# Patient Record
Sex: Male | Born: 1990 | Race: White | Hispanic: No | Marital: Single | Smoking: Current every day smoker
Health system: Southern US, Community
[De-identification: ages and names within clinical notes are randomized; demographics above are authoritative.]

## PROBLEM LIST (undated history)

## (undated) ENCOUNTER — Emergency Department (HOSPITAL_COMMUNITY): Admission: EM | Disposition: A | Discharge status: Home / Self Care | Payer: Self-pay

## (undated) HISTORY — PX: TONSILLECTOMY: SUR1361

---

## 2018-10-15 ENCOUNTER — Encounter (HOSPITAL_COMMUNITY): Payer: Self-pay | Admitting: Emergency Medicine

## 2018-10-15 ENCOUNTER — Other Ambulatory Visit: Payer: Self-pay

## 2018-10-15 ENCOUNTER — Emergency Department (HOSPITAL_COMMUNITY)
Admission: EM | Admit: 2018-10-15 | Discharge: 2018-10-15 | Disposition: A | Discharge status: Home / Self Care | Payer: Medicaid Other | Attending: Emergency Medicine | Admitting: Emergency Medicine

## 2018-10-15 DIAGNOSIS — R51 Headache: Secondary | ICD-10-CM | POA: Diagnosis present

## 2018-10-15 DIAGNOSIS — F17210 Nicotine dependence, cigarettes, uncomplicated: Secondary | ICD-10-CM | POA: Insufficient documentation

## 2018-10-15 DIAGNOSIS — G44209 Tension-type headache, unspecified, not intractable: Secondary | ICD-10-CM | POA: Diagnosis not present

## 2018-10-15 NOTE — ED Triage Notes (Signed)
Pt c/o of headaches that will come more frequently here lately with some blurred vision. Woke up with one behind right eye with light sensitivity. Pt needs note for being out of work today.

## 2018-10-15 NOTE — ED Provider Notes (Addendum)
[facility]-EMERGENCY DEPT Provider Note   CSN: [id number] Arrival date & time: 10/15/18  0743    History   Chief Complaint Chief Complaint  Patient presents with  . Headache  . work note    HPI Cameron Carroll is a 28 y.o. male here for evaluation of headache.  6-7/10. Onset this morning upon wakening described as constant, throbbing to right side of forehead and behind right eye.  Has had a migraine at least once a week for the last month.  Occasionally tries BC powders with mild relief.  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 No previous h/o headaches. HA typically begin as mild throb and worsen during the day. Associated with light sensitivity and intermittent blurred vision.    No fever, neck pain or stiffness, diplopia, dizziness, nausea, vomiting, difficulty with speech balance, one sided numbness or weakness. No recent head trauma. No anticoagulants      HPI  History reviewed. No pertinent past medical history.  There are no active problems to display for this patient.   Past Surgical History:  Procedure Laterality Date  . TONSILLECTOMY          Home Medications    Prior to Admission medications   Medication Sig Start Date End Date Taking? Authorizing Provider  ibuprofen (ADVIL) 200 MG tablet Take 400 mg by mouth every 6 (six) hours as needed for headache.   Yes [provider]    Family History No family history on file.  Social History Social History   Tobacco Use  . Smoking status: Current Every Day Smoker    Types: Cigarettes  . Smokeless tobacco: Never Used  Substance Use Topics  . Alcohol use: Not on file  . Drug use: Not on file     Allergies   Patient has no known allergies.   Review of Systems Review of Systems  Eyes: Positive for photophobia and visual disturbance.  Neurological: Positive for headaches.   All other systems reviewed and are negative.    Physical Exam Updated Vital Signs BP 132/90 (BP Location: Left Arm)   Pulse 95   Temp 98.5 F (36.9 C) (Oral)   Resp 16   SpO2 100%   Physical Exam Vitals signs and nursing note reviewed.  Constitutional:      General: He is not in acute distress.    Appearance: He is well-developed.     Comments: NAD.  HENT:     Head: Normocephalic and atraumatic.     Comments: No temporal tenderness, palpation makes pain feel better    Right Ear: External ear normal.     Left Ear: External ear normal.     Nose: Nose normal.  Eyes:     General: No scleral icterus.    Conjunctiva/sclera: Conjunctivae normal.  Neck:     Musculoskeletal: Normal range of motion and neck supple.     Comments: No midline/paraspinal muscle tenderness. Full ROM of neck without pain, rigidity. Full chin to chest w/o meningismus.  Cardiovascular:     Rate and Rhythm: Normal rate and regular rhythm.     Heart sounds: Normal heart sounds. No murmur.  Pulmonary:     Effort: Pulmonary effort is normal.     Breath sounds: Normal breath sounds. No wheezing.  Musculoskeletal: Normal range of motion.        General: No  deformity.  Skin:    General: Skin is warm and dry.     Capillary Refill: Capillary refill takes less than 2 seconds.  Neurological:     Mental Status: He is alert and oriented to person, place, and time.     Comments: . Alert and oriented to self, place, time and event.  Speech is fluent without dysarthria or dysphasia. Strength 5/5 in upper/lower extremities.   Sensation to light touch intact in face, hands and feet. Sits on side of the bed without truncal sway No pronator drift. No leg drop. Normal finger-to-nose.  CN I not tested CN II grossly intact visual fields bilaterally. Unable to visualize posterior eye. CN III, IV, VI PEERL and EOMs intact bilaterally CN V light touch intact in all 3 divisions of trigeminal nerve CN VII facial  movements symmetric CN VIII not tested CN IX, X no uvula deviation, symmetric rise of soft palate  CN XI 5/5 SCM and trapezius strength bilaterally  CN XII Midline tongue protrusion, symmetric L/R movements  Psychiatric:        Behavior: Behavior normal.        Thought Content: Thought content normal.        Judgment: Judgment normal.      ED Treatments / Results  Labs (all labs ordered are listed, but only abnormal results are displayed) Labs Reviewed - No data to display  EKG None  Radiology No results found.  Procedures Procedures (including critical care time)  Medications Ordered in ED Medications - No data to display   Initial Impression / Assessment and Plan / ED Course  I have reviewed the triage vital signs and the nursing notes.  Pertinent labs & imaging results that were available during my care of the patient were reviewed by me and considered in my medical decision making (see chart for details).        Highest suspicion for tension type headache vs uncomplicated migraine.  Patient is without high-risk features of headache including: sudden/thunderclap HA, vision disturbances, seizures, fever, meningeal signs, use of anticoagulation, focal neuro deficits, elevated BP, trauma. On exam VS are WNL, pt is well-appearing w/ no meningismus, nystagmus, focal neuro deficits, pain over temporal arteries.  Given symptomatology and exam, doubt infectious source like meningitis, sinusitis, intracranial bleed, space occupying lesions, CVA, dissection, temporal arteritis. Given reassuring hx and exam, emergent imaging or labs not indicated. Discussed OTC oral medicines for headache.  Will defer IV migraine cocktail given low intensity of pain. Pt was comfortable with this and was eager to be discharged with work up, given. Return precautions given. OP neuro f/u as needed for recurrent HA.  Final Clinical Impressions(s) / ED Diagnoses   Final diagnoses:  Acute non  intractable tension-type headache    ED Discharge Orders    None       Jerrell Mylar 10/15/18 2015    Loren Racer, MD 10/17/18 [phone]

## 2018-10-15 NOTE — ED Notes (Signed)
He declines d/c v.s. and sign for instructions.

## 2018-10-15 NOTE — Discharge Instructions (Signed)
You were seen in the ED for headache.   I suspect headache may be due to benign migraines, tension, dehydration.   Use 600 mg ibuprofen (advil, motrin) every 6-8 hours as needed for headaches.  Can also try [phone] mg acetaminophen (tylenol) every 6-8 hours for more pain control. Excedrin migraine has caffeine that can help.  Return for worsening headaches, fever, neck pain, neck stiffness, difficulty with speech, balance, numbness or weakness to one side of your body

## 2018-12-19 ENCOUNTER — Other Ambulatory Visit: Payer: Self-pay

## 2018-12-19 ENCOUNTER — Emergency Department (HOSPITAL_COMMUNITY)
Admission: EM | Admit: 2018-12-19 | Discharge: 2018-12-19 | Disposition: A | Discharge status: Home / Self Care | Payer: Medicaid Other | Attending: Emergency Medicine | Admitting: Emergency Medicine

## 2018-12-19 ENCOUNTER — Encounter (HOSPITAL_COMMUNITY): Payer: Self-pay

## 2018-12-19 DIAGNOSIS — B349 Viral infection, unspecified: Secondary | ICD-10-CM | POA: Diagnosis not present

## 2018-12-19 DIAGNOSIS — R5383 Other fatigue: Secondary | ICD-10-CM | POA: Diagnosis present

## 2018-12-19 DIAGNOSIS — Z20828 Contact with and (suspected) exposure to other viral communicable diseases: Secondary | ICD-10-CM | POA: Insufficient documentation

## 2018-12-19 DIAGNOSIS — M7918 Myalgia, other site: Secondary | ICD-10-CM | POA: Diagnosis not present

## 2018-12-19 DIAGNOSIS — F17210 Nicotine dependence, cigarettes, uncomplicated: Secondary | ICD-10-CM | POA: Diagnosis not present

## 2018-12-19 NOTE — ED Provider Notes (Signed)
[facility] EMERGENCY DEPARTMENT Provider Note   CSN: [id number] Arrival date & time: 12/19/18  1655    History   Chief Complaint Chief Complaint  Patient presents with  . Cough  . Generalized Body Aches    HPI Cameron Carroll is a 28 y.o. male.     HPI   28 year old male presents today with complaints of fatigue.  Patient notes that he was exposed to coronavirus as his father-in-law tested positive for COVID-19.  He notes over the last several days he has had generalized body ache, some upper respiratory mucus.  He denies any productive cough fever shortness of breath abdominal pain.  No other acute concerns presently.  He notes he smokes but has no history of asthma or respiratory illnesses.  History reviewed. No pertinent past medical history.  There are no active problems to display for this patient.   Past Surgical History:  Procedure Laterality Date  . TONSILLECTOMY          Home Medications    Prior to Admission medications   Medication Sig Start Date End Date Taking? Authorizing Provider  ibuprofen (ADVIL) 200 MG tablet Take 400 mg by mouth every 6 (six) hours as needed for headache.    [provider]    Family History No family history on file.  Social History Social History   Tobacco Use  . Smoking status: Current Every Day Smoker    Types: Cigarettes  . Smokeless tobacco: Never Used  Substance Use Topics  . Alcohol use: Not on file  . Drug use: Not on file     Allergies   Patient has no known allergies.   Review of Systems Review of Systems  All other systems reviewed and are negative.    Physical Exam Updated Vital Signs BP 134/75 (BP Location: Right Arm)   Pulse 89   Temp 99 F (37.2 C) (Oral)   Resp 14   SpO2 100%   Physical Exam Vitals signs and nursing note reviewed.  Constitutional:      Appearance: He is well-developed.  HENT:     Head: Normocephalic and atraumatic.  Eyes:   General: No scleral icterus.       Right eye: No discharge.        Left eye: No discharge.     Conjunctiva/sclera: Conjunctivae normal.     Pupils: Pupils are equal, round, and reactive to light.  Neck:     Musculoskeletal: Normal range of motion.     Vascular: No JVD.     Trachea: No tracheal deviation.  Pulmonary:     Effort: Pulmonary effort is normal. No respiratory distress.     Breath sounds: Normal breath sounds. No stridor. No wheezing, rhonchi or rales.  Neurological:     Mental Status: He is alert and oriented to person, place, and time.     Coordination: Coordination normal.  Psychiatric:        Behavior: Behavior normal.        Thought Content: Thought content normal.        Judgment: Judgment normal.      ED Treatments / Results  Labs (all labs ordered are listed, but only abnormal results are displayed) Labs Reviewed  NOVEL CORONAVIRUS, NAA (HOSPITAL ORDER, SEND-OUT TO REF LAB)    EKG None  Radiology No results found.  Procedures Procedures (including critical care time)  Medications Ordered in ED Medications - No data to display   Initial Impression / Assessment  and Plan / ED Course  I have reviewed the triage vital signs and the nursing notes.  Pertinent labs & imaging results that were available during my care of the patient were reviewed by me and considered in my medical decision making (see chart for details).        28 year old male with exposure to COVID.  He has no objective findings on my exam he will be tested for COVID-19.  Discharged with strict return precautions.  He verbalized understanding and agreement to today's plan.  Cameron Carroll was evaluated in Emergency Department on 12/19/2018 for the symptoms described in the history of present illness. He was evaluated in the context of the global COVID-19 pandemic, which necessitated consideration that the patient might be at risk for infection with the SARS-CoV-2 virus that causes  COVID-19. Institutional protocols and algorithms that pertain to the evaluation of patients at risk for COVID-19 are in a state of rapid change based on information released by regulatory bodies including the CDC and federal and state organizations. These policies and algorithms were followed during the patient's care in the ED.  Final Clinical Impressions(s) / ED Diagnoses   Final diagnoses:  Viral illness    ED Discharge Orders    None       Rosalio LoudHedges, Garrit Marrow, PA-C 12/19/18 1827    Terrilee FilesButler, Michael C, MD 12/20/18 [phone]

## 2018-12-19 NOTE — Discharge Instructions (Signed)
Please read attached information. If you experience any new or worsening signs or symptoms please return to the emergency room for evaluation. Please follow-up with your primary care provider or specialist as discussed. Please use medication prescribed only as directed and discontinue taking if you have any concerning signs or symptoms.   °

## 2018-12-19 NOTE — ED Triage Notes (Signed)
Pt reports exposure to COVID from his father in law who tested positive. Pt c.o feeling like something is stuck in his throat and generalized body aches.

## 2018-12-21 LAB — NOVEL CORONAVIRUS, NAA (HOSP ORDER, SEND-OUT TO REF LAB; TAT 18-24 HRS): SARS-CoV-2, NAA: NOT DETECTED

## 2019-01-29 ENCOUNTER — Inpatient Hospital Stay (HOSPITAL_COMMUNITY)
Admission: EM | Admit: 2019-01-29 | Discharge: 2019-02-02 | DRG: 442 | Disposition: A | Discharge status: Home / Self Care | Payer: Medicaid Other | Attending: Internal Medicine | Admitting: Internal Medicine

## 2019-01-29 ENCOUNTER — Encounter (HOSPITAL_COMMUNITY): Payer: Self-pay | Admitting: *Deleted

## 2019-01-29 ENCOUNTER — Emergency Department (HOSPITAL_COMMUNITY): Payer: Medicaid Other

## 2019-01-29 DIAGNOSIS — Z87891 Personal history of nicotine dependence: Secondary | ICD-10-CM

## 2019-01-29 DIAGNOSIS — K5903 Drug induced constipation: Secondary | ICD-10-CM | POA: Diagnosis present

## 2019-01-29 DIAGNOSIS — R17 Unspecified jaundice: Secondary | ICD-10-CM | POA: Diagnosis present

## 2019-01-29 DIAGNOSIS — D696 Thrombocytopenia, unspecified: Secondary | ICD-10-CM | POA: Diagnosis not present

## 2019-01-29 DIAGNOSIS — R7989 Other specified abnormal findings of blood chemistry: Secondary | ICD-10-CM

## 2019-01-29 DIAGNOSIS — K7689 Other specified diseases of liver: Secondary | ICD-10-CM | POA: Diagnosis present

## 2019-01-29 DIAGNOSIS — B179 Acute viral hepatitis, unspecified: Secondary | ICD-10-CM | POA: Diagnosis present

## 2019-01-29 DIAGNOSIS — R112 Nausea with vomiting, unspecified: Secondary | ICD-10-CM

## 2019-01-29 DIAGNOSIS — R945 Abnormal results of liver function studies: Secondary | ICD-10-CM | POA: Diagnosis not present

## 2019-01-29 DIAGNOSIS — F1190 Opioid use, unspecified, uncomplicated: Secondary | ICD-10-CM | POA: Diagnosis present

## 2019-01-29 DIAGNOSIS — Z20828 Contact with and (suspected) exposure to other viral communicable diseases: Secondary | ICD-10-CM | POA: Diagnosis present

## 2019-01-29 DIAGNOSIS — X58XXXA Exposure to other specified factors, initial encounter: Secondary | ICD-10-CM | POA: Diagnosis present

## 2019-01-29 DIAGNOSIS — B159 Hepatitis A without hepatic coma: Secondary | ICD-10-CM

## 2019-01-29 DIAGNOSIS — D61818 Other pancytopenia: Secondary | ICD-10-CM | POA: Diagnosis present

## 2019-01-29 DIAGNOSIS — T40605A Adverse effect of unspecified narcotics, initial encounter: Secondary | ICD-10-CM | POA: Diagnosis present

## 2019-01-29 DIAGNOSIS — F1910 Other psychoactive substance abuse, uncomplicated: Secondary | ICD-10-CM | POA: Diagnosis not present

## 2019-01-29 DIAGNOSIS — Z91018 Allergy to other foods: Secondary | ICD-10-CM

## 2019-01-29 LAB — CBC
HCT: 43 % (ref 39.0–52.0)
Hemoglobin: 14.4 g/dL (ref 13.0–17.0)
MCH: 30.4 pg (ref 26.0–34.0)
MCHC: 33.5 g/dL (ref 30.0–36.0)
MCV: 90.7 fL (ref 80.0–100.0)
Platelets: 124 10*3/uL — ABNORMAL LOW (ref 150–400)
RBC: 4.74 MIL/uL (ref 4.22–5.81)
RDW: 14.4 % (ref 11.5–15.5)
WBC: 5.4 10*3/uL (ref 4.0–10.5)
nRBC: 0 % (ref 0.0–0.2)

## 2019-01-29 LAB — COMPREHENSIVE METABOLIC PANEL
ALT: 2281 U/L — ABNORMAL HIGH (ref 0–44)
AST: 1731 U/L — ABNORMAL HIGH (ref 15–41)
Albumin: 3.3 g/dL — ABNORMAL LOW (ref 3.5–5.0)
Alkaline Phosphatase: 358 U/L — ABNORMAL HIGH (ref 38–126)
Anion gap: 8 (ref 5–15)
BUN: 7 mg/dL (ref 6–20)
CO2: 29 mmol/L (ref 22–32)
Calcium: 8.7 mg/dL — ABNORMAL LOW (ref 8.9–10.3)
Chloride: 98 mmol/L (ref 98–111)
Creatinine, Ser: 0.78 mg/dL (ref 0.61–1.24)
GFR calc Af Amer: >60 mL/min (ref >60)
GFR calc non Af Amer: >60 mL/min (ref >60)
Glucose, Bld: 117 mg/dL — ABNORMAL HIGH (ref 70–99)
Potassium: 3.9 mmol/L (ref 3.5–5.1)
Sodium: 135 mmol/L (ref 135–145)
Total Bilirubin: 7.7 mg/dL — ABNORMAL HIGH (ref 0.3–1.2)
Total Protein: 6.4 g/dL — ABNORMAL LOW (ref 6.5–8.1)

## 2019-01-29 LAB — LIPASE, BLOOD: Lipase: 31 U/L (ref 11–51)

## 2019-01-29 LAB — PROTIME-INR
INR: 1.1 (ref 0.8–1.2)
Prothrombin Time: 14.4 seconds (ref 11.4–15.2)

## 2019-01-29 LAB — ACETAMINOPHEN LEVEL: Acetaminophen (Tylenol), Serum: <10 ug/mL — ABNORMAL LOW (ref 10–30)

## 2019-01-29 MED ORDER — ENOXAPARIN SODIUM 40 MG/0.4ML ~~LOC~~ SOLN
40.0000 mg | Freq: Every day | SUBCUTANEOUS | Status: DC
  Filled 2019-01-29 (×3): qty 0.4

## 2019-01-29 MED ORDER — TRAZODONE HCL 50 MG PO TABS
50.0000 mg | ORAL_TABLET | Freq: Once | ORAL | Status: AC
  Administered 2019-01-30: 50 mg via ORAL
  Filled 2019-01-29: qty 1

## 2019-01-29 MED ORDER — ADULT MULTIVITAMIN W/MINERALS CH
1.0000 | ORAL_TABLET | Freq: Every day | ORAL | Status: DC
  Administered 2019-01-30 – 2019-02-02 (×4): 1 via ORAL
  Filled 2019-01-29 (×4): qty 1

## 2019-01-29 MED ORDER — SODIUM CHLORIDE 0.9 % IV BOLUS
500.0000 mL | Freq: Once | INTRAVENOUS | Status: AC
  Administered 2019-01-29: 22:00:00 500 mL via INTRAVENOUS

## 2019-01-29 MED ORDER — FOLIC ACID 1 MG PO TABS
1.0000 mg | ORAL_TABLET | Freq: Every day | ORAL | Status: DC
  Administered 2019-01-30 – 2019-02-02 (×4): 1 mg via ORAL
  Filled 2019-01-29 (×4): qty 1

## 2019-01-29 MED ORDER — SODIUM CHLORIDE 0.9% FLUSH
3.0000 mL | Freq: Once | INTRAVENOUS | Status: AC
  Administered 2019-01-30: 3 mL via INTRAVENOUS

## 2019-01-29 MED ORDER — VITAMIN B-1 100 MG PO TABS
100.0000 mg | ORAL_TABLET | Freq: Every day | ORAL | Status: DC
  Administered 2019-01-30 – 2019-02-02 (×4): 100 mg via ORAL
  Filled 2019-01-29 (×4): qty 1

## 2019-01-29 NOTE — H&P (Addendum)
History and Physical    Cameron Robert Lovvorn WUJ:[id number] DOB: 09/22/1990 DOA: 01/29/2019  PCP: Patient, No Pcp Per Patient coming from: Home  I have personally briefly reviewed [facility]  Chief Complaint: Jaundice  HPI: Cameron Carroll is a 28 y.o. male with medical history significant for IVDU who presented to the ED with 2 to 3 days of yellowing of the eyes and skin, along with dark urine and mild fatigue.  Patient reports that 1 to 2 weeks ago, he had an episode of nausea and abdominal pain that lasted approximately 1 day and then spontaneously resolved.  He also reports anosmia and dysgeusia approximately 2 weeks ago that has also since resolved.  He states that he is a frequent user of IV opiates, his last use was 3 days ago.  He does not have a known history of hepatitis nor does he have a family history of liver disease.  He denies recent antibiotic use, denies any over-the-counter medications or supplements with the exception of an occasional BC powder.  He denies recent fever, rash.  No similar symptoms in any close contacts.  He denies significant alcohol use.  ED Course: In the ED patient afebrile and hemodynamic stable.  Labs notable for total bilirubin of 7.7 (fractionation not performed), AST 1731, ALT 2281 and alkaline phosphatase of 358.  Coagulation parameters are within normal limits.  Renal function within normal lites.  Acetaminophen levels are normal.  [facility] was consulted in the ED and recommended admission for further work-up.  Review of Systems: As per HPI otherwise 10 point review of systems negative.   History reviewed. No pertinent past medical history.  Past Surgical History:  Procedure Laterality Date  . TONSILLECTOMY       reports that he has been smoking cigarettes. He has never used smokeless tobacco. He reports current alcohol use. He reports current drug use. Drug: IV.  Allergies  Allergen  Reactions  . Other Anaphylaxis and Nausea And Vomiting    Can tuna and tree nuts  . Pistachio Nut Extract Skin Test Anaphylaxis   No family history of liver disease.  Prior to Admission medications   Medication Sig Start Date End Date Taking? Authorizing Provider  Buprenorphine HCl-Naloxone HCl (SUBOXONE SL) Place 1 Film under the tongue as needed.   Yes [provider]    Physical Exam: Vitals:   01/29/19 1837  BP: 125/69  Pulse: 93  Resp: 12  Temp: 98.7 F (37.1 C)  TempSrc: Oral  SpO2: 100%    Constitutional: NAD, calm, comfortable Eyes: PERRL, conjunctivae are icteric ENMT: Mucous membranes are moist. Posterior pharynx clear of any exudate or lesions. Neck: normal, supple, no masses Respiratory: clear to auscultation bilaterally, no wheezing, no crackles. Normal respiratory effort. No accessory muscle use.  Cardiovascular: Regular rate and rhythm, no murmurs / rubs / gallops. No extremity edema. 2+ pedal pulses. No carotid bruits.  Abdomen: no tenderness, no masses palpated. No hepatosplenomegaly. Bowel sounds positive. No asterixis. Musculoskeletal: no clubbing / cyanosis. No joint deformity upper and lower extremities. Good ROM, no contractures. Normal muscle tone.   Skin: Slight jaundice.  No rashes, lesions, ulcers. No induration.  Numerous tattoos. Neurologic: CN 2-12 grossly intact. Sensation intact, DTR normal. Strength 5/5 in all 4.  Psychiatric: Normal judgment and insight. Alert and oriented x 3. Normal mood.    Labs on Admission: I have personally reviewed following labs and imaging studies  CBC: Recent Labs  Lab  01/29/19 1852  WBC 5.4  HGB 14.4  HCT 43.0  MCV 90.7  PLT 124*   Basic Metabolic Panel: Recent Labs  Lab 01/29/19 1852  NA 135  K 3.9  CL 98  CO2 29  GLUCOSE 117*  BUN 7  CREATININE 0.78  CALCIUM 8.7*   GFR: CrCl cannot be calculated (Unknown ideal weight.). Liver Function Tests: Recent Labs  Lab 01/29/19 1852  AST  1,731*  ALT 2,281*  ALKPHOS 358*  BILITOT 7.7*  PROT 6.4*  ALBUMIN 3.3*   Recent Labs  Lab 01/29/19 1852  LIPASE 31   No results for input(s): AMMONIA in the last 168 hours. Coagulation Profile: Recent Labs  Lab 01/29/19 1852  INR 1.1   Cardiac Enzymes: No results for input(s): CKTOTAL, CKMB, CKMBINDEX, TROPONINI in the last 168 hours. BNP (last 3 results) No results for input(s): PROBNP in the last 8760 hours. HbA1C: No results for input(s): HGBA1C in the last 72 hours. CBG: No results for input(s): GLUCAP in the last 168 hours. Lipid Profile: No results for input(s): CHOL, HDL, LDLCALC, TRIG, CHOLHDL, LDLDIRECT in the last 72 hours. Thyroid Function Tests: No results for input(s): TSH, T4TOTAL, FREET4, T3FREE, THYROIDAB in the last 72 hours. Anemia Panel: No results for input(s): VITAMINB12, FOLATE, FERRITIN, TIBC, IRON, RETICCTPCT in the last 72 hours. Urine analysis: No results found for: COLORURINE, APPEARANCEUR, LABSPEC, PHURINE, GLUCOSEU, HGBUR, BILIRUBINUR, KETONESUR, PROTEINUR, UROBILINOGEN, NITRITE, LEUKOCYTESUR  Radiological Exams on Admission: Dg Chest Portable 1 View  Result Date: 01/29/2019 CLINICAL DATA:  Acute shortness of breath. EXAM: PORTABLE CHEST 1 VIEW COMPARISON:  None. FINDINGS: The cardiomediastinal silhouette is unremarkable. There is no evidence of focal airspace disease, pulmonary edema, suspicious pulmonary nodule/mass, pleural effusion, or pneumothorax. No acute bony abnormalities are identified. IMPRESSION: No active disease. Electronically Signed   By: Harmon Pier M.D.   On: 01/29/2019 19:46   US Abdomen Limited Ruq  Result Date: 01/29/2019 CLINICAL DATA:  28 year old male with jaundice, elevated LFTs and vomiting. EXAM: ULTRASOUND ABDOMEN LIMITED RIGHT UPPER QUADRANT COMPARISON:  None. FINDINGS: Gallbladder: Gallbladder is contracted. Apparent wall thickening could be secondary to contraction. No evidence of sonographic Murphy sign or  pericholecystic fluid. No definite cholelithiasis noted. Common bile duct: Diameter: 4 mm. No evidence of intrahepatic or extrahepatic biliary dilatation. Liver: No focal lesion identified. Within normal limits in parenchymal echogenicity. Portal vein is patent on color Doppler imaging with normal direction of blood flow towards the liver. Other: None. IMPRESSION: 1. Apparent wall thickening of the gallbladder which may be secondary to contracted state. No definite cholelithiasis or other signs of acute cholecystitis. 2. Unremarkable liver.  No biliary dilatation. Electronically Signed   By: Harmon Pier M.D.   On: 01/29/2019 20:44    Assessment/Plan Active Problems:   Acute hepatitis  Mr. Iwen p/w acute hepatitis of unknown etiology. Viral hepatitis is favored, however, the profound elevation in his LFTs with hyperbilirubinemia is somewhat out of proportion with expectations. Ischemic hepatitis and DILI can present similarly. Wilson's unlikely, but reasonable to rule out, as is hemochromatosis. He does not have any associated encephalopathy at this time. Liver U/S showed no acute abnormalities; specifically no evidence for Budd-Chiari, portal vein thrombus, cholangitis/cholecsystitis or cirrhosis. There is no urgent need for referral to transplant center, however, MELD score is 15 and MELDNa score is 17.  Acute hepatitis - GI ([facility]) consulted in ED - F/u hepatitis panel, HIV, iron profile, ceruloplasmin -Fractionate bilirubin - Check ammonia level - Further workup pending results  of above - Monitor hepatic function, coagulation factors, renal function and neurologic status - Neuro checks - Covid testing to be performed per protocol  H/o IVDU - HIV, hepatitis panel as above - Pt has no Rx for Suboxone/Subutex, will not start on admission without follow up plan - Monitor for withdrawal symptoms  DVT prophylaxis: Lovenox Code Status: Full Family Communication: None Disposition Plan: Home  in 1-3 days Consults called: GI Velora Heckler) Admission status: Inpatient  Bennie Pierini MD [facility]  If 7PM-7AM, please contact night-coverage [email or web address] Password [facility]  01/29/2019, 9:38 PM

## 2019-01-29 NOTE — ED Provider Notes (Signed)
Emergency Department Provider Note   I have reviewed the triage vital signs and the nursing notes.   HISTORY  Chief Complaint Eye Problem and Emesis   HPI Cameron Carroll is a 28 y.o. male with PMH of IVDA presents to the ED with yellowing of the eyes and skin, dark urine, and nausea. Patient denies abdominal pain.  No fevers.  She denies any bright red blood or black material in his vomit.  He has been using IV drugs intermittently over the past 2 years and last used 2 days ago.  He reports always using clean needles.  He did spend some time in prison and has tattoos which he got while in jail.  He reports some mild shortness of breath with exertion but denies chest pain.  He has had a mild headache as well.  He denies any known history of HIV or hepatitis.  He has not been taking Tylenol.   History reviewed. No pertinent past medical history.  Patient Active Problem List   Diagnosis Date Noted  . Acute hepatitis 01/29/2019    Past Surgical History:  Procedure Laterality Date  . TONSILLECTOMY      Allergies Other and Pistachio nut extract skin test  No family history on file.  Social History Social History   Tobacco Use  . Smoking status: Current Every Day Smoker    Types: Cigarettes  . Smokeless tobacco: Never Used  Substance Use Topics  . Alcohol use: Yes  . Drug use: Yes    Types: IV    Review of Systems  Constitutional: No fever/chills. Positive generalized weakness.  Eyes: No visual changes. Yellow discoloration of the eyes.  ENT: No sore throat.  Cardiovascular: Denies chest pain. Respiratory: Positive shortness of breath with exertion only.  Gastrointestinal: No abdominal pain.  No nausea, no vomiting.  No diarrhea.  No constipation. Genitourinary: Negative for dysuria. Dark color of urine.  Musculoskeletal: Negative for back pain. Skin: Negative for rash. Yellowing of the skin.  Neurological: Negative for focal weakness or numbness. Positive  HA.  10-point ROS otherwise negative.  ____________________________________________   PHYSICAL EXAM:  VITAL SIGNS: ED Triage Vitals  Enc Vitals Group     BP 01/29/19 1837 125/69     Pulse Rate 01/29/19 1837 93     Resp 01/29/19 1837 12     Temp 01/29/19 1837 98.7 F (37.1 C)     Temp Source 01/29/19 1837 Oral     SpO2 01/29/19 1837 100 %   Constitutional: Alert and oriented. Well appearing and in no acute distress. Patient with notable jaundice.  Eyes: Conjunctivae are normal. Icteric sclera noted.  Head: Atraumatic. Nose: No congestion/rhinnorhea. Mouth/Throat: Mucous membranes are moist.  Neck: No stridor.   Cardiovascular: Normal rate, regular rhythm. Good peripheral circulation. Grossly normal heart sounds.   Respiratory: Normal respiratory effort.  No retractions. Lungs CTAB. Gastrointestinal: Soft and nontender. No distention.  Musculoskeletal: No lower extremity tenderness nor edema. No gross deformities of extremities. Neurologic:  Normal speech and language. No gross focal neurologic deficits are appreciated.  Skin:  Skin is warm, dry and intact. No rash noted.   ____________________________________________   LABS (all labs ordered are listed, but only abnormal results are displayed)  Labs Reviewed  COMPREHENSIVE METABOLIC PANEL - Abnormal; Notable for the following components:      Result Value   Glucose, Bld 117 (*)    Calcium 8.7 (*)    Total Protein 6.4 (*)    Albumin 3.3 (*)  AST 1,731 (*)    ALT 2,281 (*)    Alkaline Phosphatase 358 (*)    Total Bilirubin 7.7 (*)    All other components within normal limits  CBC - Abnormal; Notable for the following components:   Platelets 124 (*)    All other components within normal limits  URINALYSIS, ROUTINE W REFLEX MICROSCOPIC - Abnormal; Notable for the following components:   Color, Urine AMBER (*)    Bilirubin Urine MODERATE (*)    All other components within normal limits  ACETAMINOPHEN LEVEL -  Abnormal; Notable for the following components:   Acetaminophen (Tylenol), Serum <10 (*)    All other components within normal limits  BILIRUBIN, DIRECT - Abnormal; Notable for the following components:   Bilirubin, Direct 4.8 (*)    All other components within normal limits  COMPREHENSIVE METABOLIC PANEL - Abnormal; Notable for the following components:   Glucose, Bld 108 (*)    Calcium 8.2 (*)    Total Protein 5.3 (*)    Albumin 2.6 (*)    AST 1,332 (*)    ALT 1,854 (*)    Alkaline Phosphatase 317 (*)    Total Bilirubin 6.3 (*)    All other components within normal limits  CBC - Abnormal; Notable for the following components:   WBC 3.6 (*)    RBC 4.10 (*)    Hemoglobin 12.2 (*)    HCT 36.6 (*)    Platelets 104 (*)    All other components within normal limits  PROTIME-INR - Abnormal; Notable for the following components:   Prothrombin Time 15.5 (*)    All other components within normal limits  IRON AND TIBC - Abnormal; Notable for the following components:   Saturation Ratios 47 (*)    All other components within normal limits  FERRITIN - Abnormal; Notable for the following components:   Ferritin 1,914 (*)    All other components within normal limits  CK - Abnormal; Notable for the following components:   Total CK 38 (*)    All other components within normal limits  CULTURE, BLOOD (ROUTINE X 2)  CULTURE, BLOOD (ROUTINE X 2)  SARS CORONAVIRUS 2 (TAT 6-12 HRS)  LIPASE, BLOOD  PROTIME-INR  TSH  AMMONIA  HEPATITIS PANEL, ACUTE  HIV ANTIBODY (ROUTINE TESTING W REFLEX)  RPR  CERULOPLASMIN   ____________________________________________  RADIOLOGY  Dg Chest Portable 1 View  Result Date: 01/29/2019 CLINICAL DATA:  Acute shortness of breath. EXAM: PORTABLE CHEST 1 VIEW COMPARISON:  None. FINDINGS: The cardiomediastinal silhouette is unremarkable. There is no evidence of focal airspace disease, pulmonary edema, suspicious pulmonary nodule/mass, pleural effusion, or  pneumothorax. No acute bony abnormalities are identified. IMPRESSION: No active disease. Electronically Signed   By: Harmon Pier M.D.   On: 01/29/2019 19:46   US Abdomen Limited Ruq  Result Date: 01/29/2019 CLINICAL DATA:  28 year old male with jaundice, elevated LFTs and vomiting. EXAM: ULTRASOUND ABDOMEN LIMITED RIGHT UPPER QUADRANT COMPARISON:  None. FINDINGS: Gallbladder: Gallbladder is contracted. Apparent wall thickening could be secondary to contraction. No evidence of sonographic Murphy sign or pericholecystic fluid. No definite cholelithiasis noted. Common bile duct: Diameter: 4 mm. No evidence of intrahepatic or extrahepatic biliary dilatation. Liver: No focal lesion identified. Within normal limits in parenchymal echogenicity. Portal vein is patent on color Doppler imaging with normal direction of blood flow towards the liver. Other: None. IMPRESSION: 1. Apparent wall thickening of the gallbladder which may be secondary to contracted state. No definite cholelithiasis or other signs  of acute cholecystitis. 2. Unremarkable liver.  No biliary dilatation. Electronically Signed   By: Harmon PierJeffrey  Hu M.D.   On: 01/29/2019 20:44    ____________________________________________   PROCEDURES  Procedure(s) performed:   Procedures  None ____________________________________________   INITIAL IMPRESSION / ASSESSMENT AND PLAN / ED COURSE  Pertinent labs & imaging results that were available during my care of the patient were reviewed by me and considered in my medical decision making (see chart for details).   Patient presents to the emergency department for evaluation of jaundice, vomiting, fatigue.  He has jaundice on exam.  No focal abdominal tenderness.  Afebrile here with normal vitals.  Normal mental status.  Risk factors for hepatitis include IV drug abuse and prison tattoos.   09:10 PM  Spoke with Dr. Barron Alvineirigliano with [facility]. They will consult as inpatient. No additional w/u tonight.   US  and labs reviewed. Will admit.   Discussed patient's case with [facility] to request admission. Patient and family (if present) updated with plan. [facility].  I reviewed all nursing notes, vitals, pertinent old records, EKGs, labs, imaging (as available).  ____________________________________________  FINAL CLINICAL IMPRESSION(S) / ED DIAGNOSES  Final diagnoses:  Jaundice  Elevated LFTs  Non-intractable vomiting with nausea, unspecified vomiting type     MEDICATIONS GIVEN DURING THIS VISIT:  Medications  enoxaparin (LOVENOX) injection 40 mg (40 mg Subcutaneous Not Given 01/30/19 0943)  folic acid (FOLVITE) tablet 1 mg (1 mg Oral Given 01/30/19 0942)  multivitamin with minerals tablet 1 tablet (1 tablet Oral Given 01/30/19 0942)  thiamine (VITAMIN B-1) tablet 100 mg (100 mg Oral Given 01/30/19 0942)  sodium chloride flush (NS) 0.9 % injection 3 mL (3 mLs Intravenous Given 01/30/19 0008)  sodium chloride 0.9 % bolus 500 mL (0 mLs Intravenous Stopped 01/29/19 2309)  traZODone (DESYREL) tablet 50 mg (50 mg Oral Given 01/30/19 0007)    Note:  This document was prepared using Dragon voice recognition software and may include unintentional dictation errors.  Alona BeneJoshua Long, MD Emergency Medicine    Long, Arlyss RepressJoshua G, MD 01/30/19 1021

## 2019-01-29 NOTE — ED Notes (Signed)
IV team placed IV on pts AC, as desired by the pt. RN informed.

## 2019-01-29 NOTE — ED Notes (Signed)
ED TO INPATIENT HANDOFF REPORT  ED Nurse Name and Phone #: Lanora Manis [phone]  S Name/Age/Gender Cameron Carroll 28 y.o. male Room/Bed: 029C/029C  Code Status   Code Status: Not on file  Home/SNF/Other Home Patient oriented to: self, place, time and situation Is this baseline? Yes   Triage Complete: Triage complete  Chief Complaint urination problem, eye pain  Triage Note Pt reports his eyes have a yellowish color, vomiting, "dark colored" urine. Pt reports IV drug use (last used about 2 days ago)   Allergies Allergies  Allergen Reactions  . Other Anaphylaxis and Nausea And Vomiting    Can tuna and tree nuts  . Pistachio Nut Extract Skin Test Anaphylaxis    Level of Care/Admitting Diagnosis ED Disposition    ED Disposition Condition Comment   Admit  Hospital [facility] [[id number]]  Level of Care: Med-Surg [16]  Covid Evaluation: Asymptomatic Screening Protocol (No Symptoms)  Diagnosis: Acute hepatitis [915056]  Admitting Physician: Marcelo Baldy [[id number]]  Attending Physician: Arlean Hopping, ADAM ROSS [[id number]]  Estimated length of stay: past midnight tomorrow  Certification:: I certify this patient will need inpatient services for at least 2 midnights  PT Class (Do Not Modify): Inpatient [101]  PT Acc Code (Do Not Modify): Private [1]       B Medical/Surgery History History reviewed. No pertinent past medical history. Past Surgical History:  Procedure Laterality Date  . TONSILLECTOMY       A IV Location/Drains/Wounds Patient Lines/Drains/Airways Status   Active Line/Drains/Airways    Name:   Placement date:   Placement time:   Site:   Days:   Peripheral IV 01/29/19 Left Antecubital   01/29/19    2200    Antecubital   less than 1          Intake/Output Last 24 hours No intake or output data in the 24 hours ending 01/29/19 2244  Labs/Imaging Results for orders placed or performed during the hospital encounter of 01/29/19  (from the past 48 hour(s))  Lipase, blood     Status: None   Collection Time: 01/29/19  6:52 PM  Result Value Ref Range   Lipase 31 11 - 51 U/L    Comment: Performed at [facility], [address] [place], [place] [address]  Comprehensive metabolic panel     Status: Abnormal   Collection Time: 01/29/19  6:52 PM  Result Value Ref Range   Sodium 135 135 - 145 mmol/L   Potassium 3.9 3.5 - 5.1 mmol/L   Chloride 98 98 - 111 mmol/L   CO2 29 22 - 32 mmol/L   Glucose, Bld 117 (H) 70 - 99 mg/dL   BUN 7 6 - 20 mg/dL   Creatinine, Ser 4.82 0.61 - 1.24 mg/dL   Calcium 8.7 (L) 8.9 - 10.3 mg/dL   Total Protein 6.4 (L) 6.5 - 8.1 g/dL   Albumin 3.3 (L) 3.5 - 5.0 g/dL   AST 7,078 (H) 15 - 41 U/L   ALT 2,281 (H) 0 - 44 U/L    Comment: RESULTS CONFIRMED BY MANUAL DILUTION   Alkaline Phosphatase 358 (H) 38 - 126 U/L   Total Bilirubin 7.7 (H) 0.3 - 1.2 mg/dL   GFR calc non Af Amer >60 >60 mL/min   GFR calc Af Amer >60 >60 mL/min   Anion gap 8 5 - 15    Comment: Performed at [facility], [address], [place], [place] [address]  CBC  Status: Abnormal   Collection Time: 01/29/19  6:52 PM  Result Value Ref Range   WBC 5.4 4.0 - 10.5 K/uL   RBC 4.74 4.22 - 5.81 MIL/uL   Hemoglobin 14.4 13.0 - 17.0 g/dL   HCT 43.0 39.0 - 52.0 %   MCV 90.7 80.0 - 100.0 fL   MCH 30.4 26.0 - 34.0 pg   MCHC 33.5 30.0 - 36.0 g/dL   RDW 14.4 11.5 - 15.5 %   Platelets 124 (L) 150 - 400 K/uL   nRBC 0.0 0.0 - 0.2 %    Comment: Performed at [facility], [place] [address] [place], [place] [address]  Protime-INR     Status: None   Collection Time: 01/29/19  6:52 PM  Result Value Ref Range   Prothrombin Time 14.4 11.4 - 15.2 seconds   INR 1.1 0.8 - 1.2    Comment: (NOTE) INR goal varies based on device and disease states. Performed at [facility], [place] [address], [place], [place] [address]   Acetaminophen level     Status: Abnormal   Collection Time: 01/29/19  7:19 PM  Result Value Ref  Range   Acetaminophen (Tylenol), Serum <10 (L) 10 - 30 ug/mL    Comment: Performed at [place] [address], [place], [place] [address]   Dg Chest Portable 1 View  Result Date: 01/29/2019 CLINICAL DATA:  Acute shortness of breath. EXAM: PORTABLE CHEST 1 VIEW COMPARISON:  None. FINDINGS: The cardiomediastinal silhouette is unremarkable. There is no evidence of focal airspace disease, pulmonary edema, suspicious pulmonary nodule/mass, pleural effusion, or pneumothorax. No acute bony abnormalities are identified. IMPRESSION: No active disease. Electronically Signed   By: Margarette Canada M.D.   On: 01/29/2019 19:46   US Abdomen Limited Ruq  Result Date: 01/29/2019 CLINICAL DATA:  28 year old male with jaundice, elevated LFTs and vomiting. EXAM: ULTRASOUND ABDOMEN LIMITED RIGHT UPPER QUADRANT COMPARISON:  None. FINDINGS: Gallbladder: Gallbladder is contracted. Apparent wall thickening could be secondary to contraction. No evidence of sonographic Murphy sign or pericholecystic fluid. No definite cholelithiasis noted. Common bile duct: Diameter: 4 mm. No evidence of intrahepatic or extrahepatic biliary dilatation. Liver: No focal lesion identified. Within normal limits in parenchymal echogenicity. Portal vein is patent on color Doppler imaging with normal direction of blood flow towards the liver. Other: None. IMPRESSION: 1. Apparent wall thickening of the gallbladder which may be secondary to contracted state. No definite cholelithiasis or other signs of acute cholecystitis. 2. Unremarkable liver.  No biliary dilatation. Electronically Signed   By: Margarette Canada M.D.   On: 01/29/2019 20:44    Pending Labs Unresulted Labs (From admission, onward)    Start     Ordered   01/29/19 2221  Bilirubin, direct  Add-on,   AD     01/29/19 2220   01/29/19 2004  SARS CORONAVIRUS 2 (TAT 6-12 HRS) Nasal Swab Aptima Multi Swab  (Asymptomatic/Tier 2 Patients Labs)  ONCE - STAT,   STAT    Question Answer Comment   Is this test for diagnosis or screening Screening   Symptomatic for COVID-19 as defined by CDC No   Hospitalized for COVID-19 No   Admitted to ICU for COVID-19 No   Previously tested for COVID-19 No   Resident in a congregate (group) care setting No   Employed in healthcare setting No      01/29/19 2003   01/29/19 1917  HIV antibody  Once,   STAT  01/29/19 1917   01/29/19 1917  RPR  ONCE - STAT,   STAT     01/29/19 1917   01/29/19 1915  Culture, blood (routine x 2)  BLOOD CULTURE X 2,   STAT     01/29/19 1917   01/29/19 1915  Hepatitis panel, acute  ONCE - STAT,   STAT     01/29/19 1917   01/29/19 1842  Urinalysis, Routine w reflex microscopic  ONCE - STAT,   STAT     01/29/19 1842   Signed and Held  TSH  Add-on,   R     Signed and Held   Signed and Held  Comprehensive metabolic panel  Tomorrow morning,   R     Signed and Held   Signed and Held  CBC  Tomorrow morning,   R     Signed and Held   Signed and Held  Protime-INR  Tomorrow morning,   R     Signed and Held   Signed and Held  Ceruloplasmin  Add-on,   R     Signed and Held   Signed and Held  Iron and TIBC  Add-on,   R     Signed and Held   Signed and Held  Ferritin  Add-on,   R     Signed and Held   Signed and Held  CK  Add-on,   R     Signed and Held   Signed and Held  Ammonia  Once,   R     Signed and Held          Vitals/Pain Today's Vitals   01/29/19 1841 01/29/19 1930 01/29/19 1945 01/29/19 2000  BP:  117/78 114/67 129/75  Pulse:  91 74 73  Resp:  11 10 15   Temp:      TempSrc:      SpO2:  100% 99% (!) 81%  PainSc: 0-No pain       Isolation Precautions No active isolations  Medications Medications  sodium chloride flush (NS) 0.9 % injection 3 mL (has no administration in time range)  sodium chloride 0.9 % bolus 500 mL (500 mLs Intravenous New Bag/Given 01/29/19 2218)    Mobility walks Low fall risk   Focused Assessments GI   R Recommendations: See Admitting Provider Note  Report  given to:   Additional Notes:

## 2019-01-29 NOTE — ED Triage Notes (Signed)
Pt reports his eyes have a yellowish color, vomiting, "dark colored" urine. Pt reports IV drug use (last used about 2 days ago)

## 2019-01-29 NOTE — ED Notes (Signed)
Pt made aware of the need for urine.

## 2019-01-30 ENCOUNTER — Other Ambulatory Visit: Payer: Self-pay

## 2019-01-30 ENCOUNTER — Encounter (HOSPITAL_COMMUNITY): Payer: Self-pay | Admitting: General Practice

## 2019-01-30 DIAGNOSIS — F1910 Other psychoactive substance abuse, uncomplicated: Secondary | ICD-10-CM

## 2019-01-30 DIAGNOSIS — B179 Acute viral hepatitis, unspecified: Secondary | ICD-10-CM

## 2019-01-30 LAB — URINALYSIS, ROUTINE W REFLEX MICROSCOPIC
Glucose, UA: NEGATIVE mg/dL
Hgb urine dipstick: NEGATIVE
Ketones, ur: NEGATIVE mg/dL
Leukocytes,Ua: NEGATIVE
Nitrite: NEGATIVE
Protein, ur: NEGATIVE mg/dL
Specific Gravity, Urine: 1.008 (ref 1.005–1.030)
pH: 7 (ref 5.0–8.0)

## 2019-01-30 LAB — IRON AND TIBC
Iron: 137 ug/dL (ref 45–182)
Saturation Ratios: 47 % — ABNORMAL HIGH (ref 17.9–39.5)
TIBC: 294 ug/dL (ref 250–450)
UIBC: 157 ug/dL

## 2019-01-30 LAB — FERRITIN: Ferritin: 1914 ng/mL — ABNORMAL HIGH (ref 24–336)

## 2019-01-30 LAB — CK: Total CK: 38 U/L — ABNORMAL LOW (ref 49–397)

## 2019-01-30 LAB — CBC
HCT: 36.6 % — ABNORMAL LOW (ref 39.0–52.0)
Hemoglobin: 12.2 g/dL — ABNORMAL LOW (ref 13.0–17.0)
MCH: 29.8 pg (ref 26.0–34.0)
MCHC: 33.3 g/dL (ref 30.0–36.0)
MCV: 89.3 fL (ref 80.0–100.0)
Platelets: 104 10*3/uL — ABNORMAL LOW (ref 150–400)
RBC: 4.1 MIL/uL — ABNORMAL LOW (ref 4.22–5.81)
RDW: 14.5 % (ref 11.5–15.5)
WBC: 3.6 10*3/uL — ABNORMAL LOW (ref 4.0–10.5)
nRBC: 0 % (ref 0.0–0.2)

## 2019-01-30 LAB — TSH: TSH: 0.946 u[IU]/mL (ref 0.350–4.500)

## 2019-01-30 LAB — COMPREHENSIVE METABOLIC PANEL
ALT: 1854 U/L — ABNORMAL HIGH (ref 0–44)
AST: 1332 U/L — ABNORMAL HIGH (ref 15–41)
Albumin: 2.6 g/dL — ABNORMAL LOW (ref 3.5–5.0)
Alkaline Phosphatase: 317 U/L — ABNORMAL HIGH (ref 38–126)
Anion gap: 8 (ref 5–15)
BUN: 6 mg/dL (ref 6–20)
CO2: 28 mmol/L (ref 22–32)
Calcium: 8.2 mg/dL — ABNORMAL LOW (ref 8.9–10.3)
Chloride: 100 mmol/L (ref 98–111)
Creatinine, Ser: 0.69 mg/dL (ref 0.61–1.24)
GFR calc Af Amer: >60 mL/min (ref >60)
GFR calc non Af Amer: >60 mL/min (ref >60)
Glucose, Bld: 108 mg/dL — ABNORMAL HIGH (ref 70–99)
Potassium: 3.6 mmol/L (ref 3.5–5.1)
Sodium: 136 mmol/L (ref 135–145)
Total Bilirubin: 6.3 mg/dL — ABNORMAL HIGH (ref 0.3–1.2)
Total Protein: 5.3 g/dL — ABNORMAL LOW (ref 6.5–8.1)

## 2019-01-30 LAB — PROTIME-INR
INR: 1.2 (ref 0.8–1.2)
Prothrombin Time: 15.5 seconds — ABNORMAL HIGH (ref 11.4–15.2)

## 2019-01-30 LAB — BILIRUBIN, DIRECT: Bilirubin, Direct: 4.8 mg/dL — ABNORMAL HIGH (ref 0.0–0.2)

## 2019-01-30 LAB — SARS CORONAVIRUS 2 (TAT 6-24 HRS): SARS Coronavirus 2: NEGATIVE

## 2019-01-30 LAB — RPR: RPR Ser Ql: NONREACTIVE

## 2019-01-30 LAB — HIV ANTIBODY (ROUTINE TESTING W REFLEX): HIV Screen 4th Generation wRfx: NONREACTIVE

## 2019-01-30 LAB — AMMONIA: Ammonia: 35 umol/L (ref 9–35)

## 2019-01-30 MED ORDER — METHADONE HCL 10 MG PO TABS
20.0000 mg | ORAL_TABLET | Freq: Two times a day (BID) | ORAL | Status: DC
  Administered 2019-01-30 – 2019-01-31 (×3): 20 mg via ORAL
  Filled 2019-01-30 (×3): qty 2

## 2019-01-30 MED ORDER — NICOTINE 21 MG/24HR TD PT24
21.0000 mg | MEDICATED_PATCH | Freq: Every day | TRANSDERMAL | Status: DC
  Administered 2019-01-30 – 2019-02-01 (×3): 21 mg via TRANSDERMAL
  Filled 2019-01-30 (×4): qty 1

## 2019-01-30 NOTE — Progress Notes (Signed)
Received patient from ED, AOx4, ambulatory, VSS, denies pain, oriented to room, bed controls, call light and explained plan of care.  Gave OJ to drink and now resting on bed comfortably watching TV.  Will monitor.

## 2019-01-30 NOTE — Consult Note (Addendum)
Referring Provider:  [facility]         Primary Care Physician:  Patient, No Pcp Per Primary Gastroenterologist:   unassigned         Reason for Consultation:  Abnormal liver tests                 ASSESSMENT /  PLAN    1. 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 No concurrent Etoh use. No tylenol use and level < 10.  Rule out acute viral hepatitis. Liver tests improved overnight. INR upper end of normal. Mentation normal, normal glucose. Of note, RUQ >>> " apparent gb wall thickening"  He has no abdominal pain.  -If acute hepatitis panel negative then will obtain additional labs to evaluate for autoimmune / genetic liver disease. Given age, Ceruloplasmin was already ordered and pending.   -am liver test and INR -monitor for mental status changes. Monitor INR.  -avoidance of IV drugs and high risk behaviors will be concern going forward.   2. Pancytopenia, new.    HPI:     Cameron Carroll is a 28 y.o. male who presented to ED yesterday with vomiting and jaundice. Symptoms started ~ 3 days ago. No associated abdominal pain, diarrhea nor fevers. He frequently uses IV heroin, last use was yesterday morning. Says he doesn't share needles. Denies other drug use in any form. Not a big consumer of Etoh. No home medications though Suboxone on home med list. . Doesn't take NSAIDS nor Tylenol. He had unprotected sex with his child's mother a couple of weeks ago but otherwise no recent sexual encounters.   Cameron denies hx of any GI problems. Parents are deceased but he doesn't think there is a [facility] of GI / liver diseases.     History reviewed. No pertinent past medical history.  Past Surgical History:  Procedure Laterality Date  . TONSILLECTOMY      Prior to Admission medications   Medication Sig Start Date End Date Taking? Authorizing Provider  Buprenorphine HCl-Naloxone HCl (SUBOXONE SL) Place 1 Film under the tongue as needed.   Yes [provider]    Current Facility-Administered Medications  Medication Dose Route Frequency Provider Last Rate Last Dose  . enoxaparin (LOVENOX) injection 40 mg  40 mg Subcutaneous Daily Marcelo Baldy, MD      . folic acid (FOLVITE) tablet 1 mg  1 mg Oral Daily Marcelo Baldy, MD   1 mg at 01/30/19 [phone]  . multivitamin with minerals tablet 1 tablet  1 tablet Oral Daily Marcelo Baldy, MD   1 tablet at 01/30/19 [phone]  . thiamine (VITAMIN B-1) tablet 100 mg  100 mg Oral Daily Marcelo Baldy, MD   100 mg at 01/30/19 3267    Allergies as of 01/29/2019 - Review Complete 01/29/2019  Allergen Reaction Noted  . Other Anaphylaxis and Nausea And Vomiting 01/29/2019  . Pistachio nut extract skin test Anaphylaxis 08/17/2018   FM[facility]: No known GI diseases or liver disease. Both parents deceased. Siblings without chronic medical problems.    Social History   Socioeconomic History  . Marital status: Single    Spouse name: Not on file  . Number of children: Not on file  . Years of education: Not on file  . Highest education level: Not on file  Occupational History  . Not on file  Social Needs  . Financial resource strain: Not on file  . Food insecurity  Worry: Not on file    Inability: Not on file  . Transportation needs    Medical: Not on file    Non-medical: Not on file  Tobacco Use  . Smoking status: Current Every Day Smoker    Types: Cigarettes  . Smokeless tobacco: Never Used  Substance and Sexual Activity  . Alcohol use: Yes  . Drug use: Yes    Types: IV  . Sexual activity: Not on file  Lifestyle  . Physical activity    Days per week: Not on file    Minutes per session: Not on file  . Stress: Not on file  Relationships  . Social Herbalist on phone: Not on file    Gets together: Not on file    Attends religious service: Not on file    Active member of club or organization: Not on file    Attends meetings of clubs or organizations: Not on file     Relationship status: Not on file  . Intimate partner violence    Fear of current or ex partner: Not on file    Emotionally abused: Not on file    Physically abused: Not on file    Forced sexual activity: Not on file  Other Topics Concern  . Not on file  Social History Narrative  . Not on file    Review of Systems: All systems reviewed and negative except where noted in HPI.  Physical Exam: Vital signs in last 24 hours: Temp:  [98.6 F (37 C)-98.7 F (37.1 C)] 98.7 F (37.1 C) (08/31 0453) Pulse Rate:  [68-93] 68 (08/31 0453) Resp:  [10-18] 18 (08/31 0453) BP: (114-129)/(62-78) 129/65 (08/31 0453) SpO2:  [81 %-100 %] 98 % (08/31 0453) Last BM Date: 01/29/19 General:   Alert, well-developed, male in NAD Psych:  Pleasant, cooperative. Flat affect Ears:  Normal auditory acuity. Nose:  No deformity, discharge,  or lesions. Neck:  Supple; no masses Lungs:  Clear throughout to auscultation.   No wheezes, crackles, or rhonchi.  Heart:  Regular rate and rhythm; no murmurs, no lower extremity edema Abdomen:  Soft, non-distended, mild RUQ tenderness, BS active, no palp mass   Rectal:  Deferred  Msk:  Symmetrical without gross deformities. . Neurologic:  Alert and  oriented x4;  grossly normal neurologically. Skin:  Intact without significant lesions or rashes. Extensive tatoos   Intake/Output from previous day: 08/30 0701 - 08/31 0700 In: 665 [P.O.:240; IV Piggyback:425] Out: 350 [Urine:350] Intake/Output this shift: Total I/O In: 237 [P.O.:237] Out: -   Lab Results: Recent Labs    01/29/19 1852 01/30/19 0230  WBC 5.4 3.6*  HGB 14.4 12.2*  HCT 43.0 36.6*  PLT 124* 104*   BMET Recent Labs    01/29/19 1852 01/30/19 0230  NA 135 136  K 3.9 3.6  CL 98 100  CO2 29 28  GLUCOSE 117* 108*  BUN 7 6  CREATININE 0.78 0.69  CALCIUM 8.7* 8.2*   LFT Recent Labs    01/29/19 1852 01/30/19 0230  PROT 6.4* 5.3*  ALBUMIN 3.3* 2.6*  AST 1,731* 1,332*  ALT 2,281*  1,854*  ALKPHOS 358* 317*  BILITOT 7.7* 6.3*  BILIDIR 4.8*  --    PT/INR Recent Labs    01/29/19 1852 01/30/19 0230  LABPROT 14.4 15.5*  INR 1.1 1.2   Hepatitis Panel No results for input(s): HEPBSAG, HCVAB, HEPAIGM, HEPBIGM in the last 72 hours.   . CBC Latest Ref Rng & Units 01/30/2019  01/29/2019  WBC 4.0 - 10.5 K/uL 3.6(L) 5.4  Hemoglobin 13.0 - 17.0 g/dL 12.2(L) 14.4  Hematocrit 39.0 - 52.0 % 36.6(L) 43.0  Platelets 150 - 400 K/uL 104(L) 124(L)    . CMP Latest Ref Rng & Units 01/30/2019 01/29/2019  Glucose 70 - 99 mg/dL 161(W108(H) 960(A117(H)  BUN 6 - 20 mg/dL 6 7  Creatinine 5.400.61 - 1.24 mg/dL 9.810.69 1.910.78  Sodium 478135 - 145 mmol/L 136 135  Potassium 3.5 - 5.1 mmol/L 3.6 3.9  Chloride 98 - 111 mmol/L 100 98  CO2 22 - 32 mmol/L 28 29  Calcium 8.9 - 10.3 mg/dL 8.2(L) 8.7(L)  Total Protein 6.5 - 8.1 g/dL 5.3(L) 6.4(L)  Total Bilirubin 0.3 - 1.2 mg/dL 6.3(H) 7.7(H)  Alkaline Phos 38 - 126 U/L 317(H) 358(H)  AST 15 - 41 U/L 1,332(H) 1,731(H)  ALT 0 - 44 U/L 1,854(H) 2,281(H)   Studies/Results: Dg Chest Portable 1 View  Result Date: 01/29/2019 CLINICAL DATA:  Acute shortness of breath. EXAM: PORTABLE CHEST 1 VIEW COMPARISON:  None. FINDINGS: The cardiomediastinal silhouette is unremarkable. There is no evidence of focal airspace disease, pulmonary edema, suspicious pulmonary nodule/mass, pleural effusion, or pneumothorax. No acute bony abnormalities are identified. IMPRESSION: No active disease. Electronically Signed   By: Harmon PierJeffrey  Hu M.D.   On: 01/29/2019 19:46   [place] Abdomen Limited Ruq  Result Date: 01/29/2019 CLINICAL DATA:  28 year old male with jaundice, elevated LFTs and vomiting. EXAM: ULTRASOUND ABDOMEN LIMITED RIGHT UPPER QUADRANT COMPARISON:  None. FINDINGS: Gallbladder: Gallbladder is contracted. Apparent wall thickening could be secondary to contraction. No evidence of sonographic Murphy sign or pericholecystic fluid. No definite cholelithiasis noted. Common bile duct:  Diameter: 4 mm. No evidence of intrahepatic or extrahepatic biliary dilatation. Liver: No focal lesion identified. Within normal limits in parenchymal echogenicity. Portal vein is patent on color Doppler imaging with normal direction of blood flow towards the liver. Other: None. IMPRESSION: 1. Apparent wall thickening of the gallbladder which may be secondary to contracted state. No definite cholelithiasis or other signs of acute cholecystitis. 2. Unremarkable liver.  No biliary dilatation. Electronically Signed   By: Harmon PierJeffrey  Hu M.D.   On: 01/29/2019 20:44    Active Problems:   Acute hepatitis   Willette Clusteraula Laquinn Shippy, NP-C @  01/30/2019, 10:47 AM

## 2019-01-30 NOTE — Progress Notes (Signed)
PROGRESS NOTE    Cameron Carroll  KVQ:[id number] DOB: 10-Dec-1990 DOA: 01/29/2019 PCP: Patient, No Pcp Per   Brief Narrative:  28 year old with history of IV drug abuse came to the hospital with complaints of change in scale color and eye color.  He was noted to be in acute liver failure with elevated total bilirubin, transaminitis.   Assessment & Plan:   Active Problems:   Acute hepatitis  Acute hepatitis, unknown etiology -Follow-up hepatitis panel, ceruloplasmin and iron profile - HIV-pending.  -Ammonia-negative -GI following. -Supportive care. -Right upper quadrant ultrasound- contracted gallbladder and wall thickening.  History of IV drug abuse -Unable to give Suboxone due to liver dysfunction, will start methadone 20 mg twice daily and uptitrate every 12 hours as needed.  Try and avoid other narcotics.  Monitor for withdrawal symptoms.    DVT prophylaxis: Lovenox Code Status: Full Family Communication: None Disposition Plan: Maintain in hospital stay until liver function has stabilized.  Consultants:   GI  Procedures:   None  Antimicrobials:   None   Subjective: No new complaints besides asking me for pain meds for his withdrawal.   Review of Systems Otherwise negative except as per HPI, including: General: Denies fever, chills, night sweats or unintended weight loss. Resp: Denies cough, wheezing, shortness of breath. Cardiac: Denies chest pain, palpitations, orthopnea, paroxysmal nocturnal dyspnea. GI: Denies abdominal pain, nausea, vomiting, diarrhea or constipation GU: Denies dysuria, frequency, hesitancy or incontinence MS: Denies muscle aches, joint pain or swelling Neuro: Denies headache, neurologic deficits (focal weakness, numbness, tingling), abnormal gait Psych: Denies anxiety, depression, SI/HI/AVH Skin: Denies new rashes or lesions ID: Denies sick contacts, exotic exposures, travel  Objective: Vitals:   01/29/19 2230 01/29/19 2231  01/29/19 2314 01/30/19 0453  BP: 128/74  124/62 129/65  Pulse:  88 78 68  Resp:   18 18  Temp:   98.6 F (37 C) 98.7 F (37.1 C)  TempSrc:   Oral Oral  SpO2:  100% 98% 98%    Intake/Output Summary (Last 24 hours) at 01/30/2019 1256 Last data filed at 01/30/2019 1003 Gross per 24 hour  Intake 902 ml  Output 350 ml  Net 552 ml   There were no vitals filed for this visit.  Examination:  General exam: Appears calm and comfortable  Respiratory system: Clear to auscultation. Respiratory effort normal. Cardiovascular system: S1 & S2 heard, RRR. No JVD, murmurs, rubs, gallops or clicks. No pedal edema. Gastrointestinal system: Abdomen is nondistended, soft and nontender. No organomegaly or masses felt. Normal bowel sounds heard. Central nervous system: Alert and oriented. No focal neurological deficits. Extremities: Symmetric 5 x 5 power. Skin: Jaundice Psychiatry: Judgement and insight appear normal. Mood & affect appropriate.  Scleral icterus   Data Reviewed:   CBC: Recent Labs  Lab 01/29/19 1852 01/30/19 0230  WBC 5.4 3.6*  HGB 14.4 12.2*  HCT 43.0 36.6*  MCV 90.7 89.3  PLT 124* 104*   Basic Metabolic Panel: Recent Labs  Lab 01/29/19 1852 01/30/19 0230  NA 135 136  K 3.9 3.6  CL 98 100  CO2 29 28  GLUCOSE 117* 108*  BUN 7 6  CREATININE 0.78 0.69  CALCIUM 8.7* 8.2*   GFR: CrCl cannot be calculated (Unknown ideal weight.). Liver Function Tests: Recent Labs  Lab 01/29/19 1852 01/30/19 0230  AST 1,731* 1,332*  ALT 2,281* 1,854*  ALKPHOS 358* 317*  BILITOT 7.7* 6.3*  PROT 6.4* 5.3*  ALBUMIN 3.3* 2.6*   Recent Labs  Lab 01/29/19 [phone]  LIPASE 31   Recent Labs  Lab 01/30/19 0831  AMMONIA 35   Coagulation Profile: Recent Labs  Lab 01/29/19 1852 01/30/19 0230  INR 1.1 1.2   Cardiac Enzymes: Recent Labs  Lab 01/29/19 1852  CKTOTAL 38*   BNP (last 3 results) No results for input(s): PROBNP in the last 8760 hours. HbA1C: No results for  input(s): HGBA1C in the last 72 hours. CBG: No results for input(s): GLUCAP in the last 168 hours. Lipid Profile: No results for input(s): CHOL, HDL, LDLCALC, TRIG, CHOLHDL, LDLDIRECT in the last 72 hours. Thyroid Function Tests: Recent Labs    01/29/19 1852  TSH 0.946   Anemia Panel: Recent Labs    01/29/19 1852  FERRITIN 1,914*  TIBC 294  IRON 137   Sepsis Labs: No results for input(s): PROCALCITON, LATICACIDVEN in the last 168 hours.  Recent Results (from the past 240 hour(s))  Culture, blood (routine x 2)     Status: None (Preliminary result)   Collection Time: 01/29/19  7:20 PM   Specimen: BLOOD  Result Value Ref Range Status   Specimen Description BLOOD RIGHT ANTECUBITAL  Final   Special Requests   Final    BOTTLES DRAWN AEROBIC AND ANAEROBIC Blood Culture results may not be optimal due to an inadequate volume of blood received in culture bottles   Culture   Final    NO GROWTH < 12 HOURS Performed at [facility] [address], [place], [place] [address]    Report Status PENDING  Incomplete  Culture, blood (routine x 2)     Status: None (Preliminary result)   Collection Time: 01/29/19  7:30 PM   Specimen: BLOOD  Result Value Ref Range Status   Specimen Description BLOOD LEFT ANTECUBITAL  Final   Special Requests   Final    BOTTLES DRAWN AEROBIC ONLY Blood Culture adequate volume   Culture   Final    NO GROWTH < 12 HOURS Performed at [facility], [place] [address] [place], [place] [address]    Report Status PENDING  Incomplete  SARS CORONAVIRUS 2 (TAT 6-12 HRS) Nasal Swab Aptima Multi Swab     Status: None   Collection Time: 01/29/19 10:16 PM   Specimen: Aptima Multi Swab; Nasal Swab  Result Value Ref Range Status   SARS Coronavirus 2 NEGATIVE NEGATIVE Final    Comment: (NOTE) SARS-CoV-2 target nucleic acids are NOT DETECTED. The SARS-CoV-2 RNA is generally detectable in upper and lower respiratory specimens during the acute phase of infection.  Negative results do not preclude SARS-CoV-2 infection, do not rule out co-infections with other pathogens, and should not be used as the sole basis for treatment or other patient management decisions. Negative results must be combined with clinical observations, patient history, and epidemiological information. The expected result is Negative. Fact Sheet for Patients: SugarRoll.be Fact Sheet for Healthcare Providers: https://www.woods-mathews.com/ This test is not yet approved or cleared by the [place] FDA and  has been authorized for detection and/or diagnosis of SARS-CoV-2 by FDA under an Emergency Use Authorization (EUA). This EUA will remain  in effect (meaning this test can be used) for the duration of the COVID-19 declaration under Section 56 4(b)(1) of the Act, 21 U.S.C. section 360bbb-3(b)(1), unless the authorization is terminated or revoked sooner. Performed at [facility], [place] [address] [place], [place] [address]          Radiology Studies: Dg Chest Portable 1 View  Result Date: 01/29/2019 CLINICAL  DATA:  Acute shortness of breath. EXAM: PORTABLE CHEST 1 VIEW COMPARISON:  None. FINDINGS: The cardiomediastinal silhouette is unremarkable. There is no evidence of focal airspace disease, pulmonary edema, suspicious pulmonary nodule/mass, pleural effusion, or pneumothorax. No acute bony abnormalities are identified. IMPRESSION: No active disease. Electronically Signed   By: Harmon PierJeffrey  Hu M.D.   On: 01/29/2019 19:46   [place] Abdomen Limited Ruq  Result Date: 01/29/2019 CLINICAL DATA:  28 year old male with jaundice, elevated LFTs and vomiting. EXAM: ULTRASOUND ABDOMEN LIMITED RIGHT UPPER QUADRANT COMPARISON:  None. FINDINGS: Gallbladder: Gallbladder is contracted. Apparent wall thickening could be secondary to contraction. No evidence of sonographic Murphy sign or pericholecystic fluid. No definite cholelithiasis noted. Common  bile duct: Diameter: 4 mm. No evidence of intrahepatic or extrahepatic biliary dilatation. Liver: No focal lesion identified. Within normal limits in parenchymal echogenicity. Portal vein is patent on color Doppler imaging with normal direction of blood flow towards the liver. Other: None. IMPRESSION: 1. Apparent wall thickening of the gallbladder which may be secondary to contracted state. No definite cholelithiasis or other signs of acute cholecystitis. 2. Unremarkable liver.  No biliary dilatation. Electronically Signed   By: Harmon PierJeffrey  Hu M.D.   On: 01/29/2019 20:44        Scheduled Meds: . enoxaparin (LOVENOX) injection  40 mg Subcutaneous Daily  . folic acid  1 mg Oral Daily  . methadone  20 mg Oral Q12H  . multivitamin with minerals  1 tablet Oral Daily  . thiamine  100 mg Oral Daily   Continuous Infusions:   LOS: 1 day   Time spent= 35 mins    Elyshia Kumagai Joline Maxcyhirag Nicolina Hirt, MD [facility]  If 7PM-7AM, please contact night-coverage [email or web address] 01/30/2019, 12:56 PM

## 2019-01-30 NOTE — Progress Notes (Signed)
Patient complaining of withdrawing. States he is aching all over and his stomach is hurting. Dr. Reesa Chew notified, awaiting orders.

## 2019-01-31 DIAGNOSIS — B159 Hepatitis A without hepatic coma: Principal | ICD-10-CM

## 2019-01-31 DIAGNOSIS — R112 Nausea with vomiting, unspecified: Secondary | ICD-10-CM

## 2019-01-31 LAB — HEPATIC FUNCTION PANEL
ALT: 1921 U/L — ABNORMAL HIGH (ref 0–44)
AST: 1276 U/L — ABNORMAL HIGH (ref 15–41)
Albumin: 2.9 g/dL — ABNORMAL LOW (ref 3.5–5.0)
Alkaline Phosphatase: 341 U/L — ABNORMAL HIGH (ref 38–126)
Bilirubin, Direct: 4.7 mg/dL — ABNORMAL HIGH (ref 0.0–0.2)
Indirect Bilirubin: 2.5 mg/dL — ABNORMAL HIGH (ref 0.3–0.9)
Total Bilirubin: 7.2 mg/dL — ABNORMAL HIGH (ref 0.3–1.2)
Total Protein: 6.3 g/dL — ABNORMAL LOW (ref 6.5–8.1)

## 2019-01-31 LAB — HEPATITIS PANEL, ACUTE
HCV Ab: <0.1 s/co ratio (ref 0.0–0.9)
HCV Ab: <0.1 s/co ratio (ref 0.0–0.9)
Hep A IgM: POSITIVE — AB
Hep A IgM: POSITIVE — AB
Hep B C IgM: NEGATIVE
Hep B C IgM: NEGATIVE
Hepatitis B Surface Ag: NEGATIVE
Hepatitis B Surface Ag: NEGATIVE

## 2019-01-31 LAB — HIV ANTIBODY (ROUTINE TESTING W REFLEX): HIV Screen 4th Generation wRfx: NONREACTIVE

## 2019-01-31 LAB — PROTIME-INR
INR: 1.2 (ref 0.8–1.2)
Prothrombin Time: 14.7 seconds (ref 11.4–15.2)

## 2019-01-31 LAB — HCV RNA QUANT: HCV Quantitative: NOT DETECTED IU/mL (ref >50)

## 2019-01-31 LAB — CERULOPLASMIN: Ceruloplasmin: 11.6 mg/dL — ABNORMAL LOW (ref 16.0–31.0)

## 2019-01-31 MED ORDER — MORPHINE SULFATE (PF) 2 MG/ML IV SOLN: 1.0000 mg | INTRAVENOUS | Status: DC | PRN

## 2019-01-31 MED ORDER — TRAZODONE HCL 50 MG PO TABS
50.0000 mg | ORAL_TABLET | Freq: Once | ORAL | Status: AC
  Administered 2019-01-31: 22:00:00 50 mg via ORAL
  Filled 2019-01-31: qty 1

## 2019-01-31 MED ORDER — ONDANSETRON HCL 4 MG PO TABS
4.0000 mg | ORAL_TABLET | Freq: Four times a day (QID) | ORAL | Status: DC | PRN
  Administered 2019-01-31 (×3): 4 mg via ORAL
  Filled 2019-01-31 (×3): qty 1

## 2019-01-31 MED ORDER — METHADONE HCL 10 MG PO TABS
25.0000 mg | ORAL_TABLET | Freq: Two times a day (BID) | ORAL | Status: DC
  Administered 2019-01-31 – 2019-02-02 (×4): 25 mg via ORAL
  Filled 2019-01-31 (×4): qty 3

## 2019-01-31 MED ORDER — OXYCODONE HCL 5 MG PO TABS
5.0000 mg | ORAL_TABLET | Freq: Once | ORAL | Status: AC
  Administered 2019-01-31: 18:00:00 5 mg via ORAL
  Filled 2019-01-31: qty 1

## 2019-01-31 NOTE — Progress Notes (Signed)
   Progress Note    ASSESSMENT AND PLAN:   1. Acute hepatitis A. . Slight worsening of liver tests overnight but still better than on admission. T. Bili 6.3 >>> 7.2.  ALT 1921 >>> 1854.  Alk phos 317 >>> 341. NR stable at 1.2  -had some nausea and mild RUQ discomfort this am ( new) but was able to eat breakfast -HAV generally self-limiting requiring only supportive care. Avoid meds with potential for hepatotoxicity.  --am CBC, liver tests with INR  2. Pancytopenia - am CBC    SUBJECTIVE   Nauseated this am but able to eat breakfast. Had some RUQ discomfort early .   OBJECTIVE:     Vital signs in last 24 hours: Temp:  [98.2 F (36.8 C)-98.9 F (37.2 C)] 98.2 F (36.8 C) (09/01 0510) Pulse Rate:  [62-78] 66 (09/01 0510) Resp:  [14-17] 16 (09/01 0510) BP: (111-131)/(63-64) 111/64 (09/01 0510) SpO2:  [100 %] 100 % (09/01 0510) Weight:  [77.1 kg] 77.1 kg (09/01 0600) Last BM Date: 01/29/19 General:   Alert, well-developed male in NAD EENT:  Normal hearing, non icteric sclera, conjunctive pink.  Heart:  Regular rate and rhythm;  No lower extremity edema   Pulm: Normal respiratory effort Abdomen:  Soft, nondistended, nontender.  Normal bowel sounds.  Neurologic:  Alert and  oriented x4;  grossly normal neurologically. Psych:  Pleasant, cooperative.  Normal mood and affect.   Intake/Output from previous day: 08/31 0701 - 09/01 0700 In: 677 [P.O.:677] Out: -  Intake/Output this shift: No intake/output data recorded.  Lab Results: Recent Labs    01/29/19 1852 01/30/19 0230  WBC 5.4 3.6*  HGB 14.4 12.2*  HCT 43.0 36.6*  PLT 124* 104*   BMET Recent Labs    01/29/19 1852 01/30/19 0230  NA 135 136  K 3.9 3.6  CL 98 100  CO2 29 28  GLUCOSE 117* 108*  BUN 7 6  CREATININE 0.78 0.69  CALCIUM 8.7* 8.2*   LFT Recent Labs    01/31/19 0209  PROT 6.3*  ALBUMIN 2.9*  AST 1,276*  ALT 1,921*  ALKPHOS 341*  BILITOT 7.2*  BILIDIR 4.7*  IBILI 2.5*   PT/INR  Recent Labs    01/30/19 0230 01/31/19 0209  LABPROT 15.5* 14.7  INR 1.2 1.2   Hepatitis Panel Recent Labs    01/30/19 1516  HEPBSAG Negative  HCVAB <0.1  HEPAIGM Positive*  HEPBIGM Negative    Dg Chest Portable 1 View  Result Date: 01/29/2019 CLINICAL DATA:  Acute shortness of breath. EXAM: PORTABLE CHEST 1 VIEW COMPARISON:  None. FINDINGS: The cardiomediastinal silhouette is unremarkable. There is no evidence of focal airspace disease, pulmonary edema, suspicious pulmonary nodule/mass, pleural effusion, or pneumothorax. No acute bony abnormalities are identified. IMPRESSION: No active disease. Electronically Signed   By: Jeffrey  Hu M.D.   On: 01/29/2019 19:46   [place] Abdomen Limited Ruq  Result Date: 01/29/2019 CLINICAL DATA:  28-year-old male with jaundice, elevated LFTs and vomiting. EXAM: ULTRASOUND ABDOMEN LIMITED RIGHT UPPER QUADRANT COMPARISON:  None. FINDINGS: Gallbladder: Gallbladder is contracted. Apparent wall thickening could be secondary to contraction. No evidence of sonographic Murphy sign or pericholecystic fluid. No definite cholelithiasis noted. Common bile duct: Diameter: 4 mm. No evidence of intrahepatic or extrahepatic biliary dilatation. Liver: No focal lesion identified. Within normal limits in parenchymal echogenicity. Portal vein is patent on color Doppler imaging with normal direction of blood flow towards the liver. Other: None. IMPRESSION: 1. Apparent wall thickening of   the gallbladder which may be secondary to contracted state. No definite cholelithiasis or other signs of acute cholecystitis. 2. Unremarkable liver.  No biliary dilatation. Electronically Signed   By: Jeffrey  Hu M.D.   On: 01/29/2019 20:44     Active Problems:   Acute hepatitis     LOS: 2 days   Paula Guenther ,NP 01/31/2019, 10:26 AM       

## 2019-01-31 NOTE — Plan of Care (Signed)
  Problem: Education: Goal: Knowledge of General Education information will improve Description: Including pain rating scale, medication(s)/side effects and non-pharmacologic comfort measures Outcome: Progressing   Problem: Activity: Goal: Risk for activity intolerance will decrease Outcome: Progressing   Problem: Nutrition: Goal: Adequate nutrition will be maintained Outcome: Progressing   

## 2019-01-31 NOTE — Progress Notes (Signed)
[facility]                                                                              Patient Demographics  Cameron Carroll, is a 28 y.o. male, DOB - 05-14-1991, EXN:[id number]  Admit date - 01/29/2019   Admitting Physician Marcelo Baldy, MD  Outpatient Primary MD for the patient is Patient, No Pcp Per  Outpatient specialists:   LOS - 2  days   Medical records reviewed and are as summarized below:    Chief Complaint  Patient presents with   Eye Problem   Emesis       Brief summary    28 year old with history of IV drug abuse came to the hospital with complaints of change in scale color and eye color.  He was noted to be in acute liver failure with elevated total bilirubin, transaminitis.  Assessment & Plan    Acute viral hepatitis with acute transaminitis -Hepatitis viral panel positive for hepatitis A IgM -HCV RNA viral load pending to exclude concurrent acute hepatitis -Currently no encephalopathy, maximize supportive care, avoid hepatotoxic medication -IV line came out, patient refuses another IV    History of drug abuse  - Unable to give Suboxone due to liver dysfunction, started on methadone 20 mg twice a day -Continues to complain of abdominal pain, not controlled with methadone, increased methadone to 25 mg twice a day, uptitrate as needed -Added IV morphine as needed for severe or breakthrough pain -Counseled strongly on drug cessation, will place social work consult   Pancytopenia -Follow CBC   Code Status: Full code DVT Prophylaxis:  Lovenox Family Communication: Discussed all imaging results, lab results, explained to the patient    Disposition Plan: Continues to remain inpatient, work-up pending, LFTs still trending up  Time Spent in minutes 25 minutes  Procedures:  None  Consultants:   Gastroenterology  Antimicrobials:   Anti-infectives (From admission, onward)   None          Medications  Scheduled  Meds:  enoxaparin (LOVENOX) injection  40 mg Subcutaneous Daily   folic acid  1 mg Oral Daily   methadone  20 mg Oral Q12H   multivitamin with minerals  1 tablet Oral Daily   nicotine  21 mg Transdermal Daily   thiamine  100 mg Oral Daily   Continuous Infusions: PRN Meds:.morphine injection, ondansetron      Subjective:   Cameron Carroll was seen and examined today.  Complaining of nausea, right upper quadrant abdominal pain, 5/10, no vomiting, fevers or chills.  Patient denies dizziness, chest pain, shortness of breath, new weakness, numbess, tingling. No acute events overnight.    Objective:   Vitals:   01/30/19 1638 01/30/19 2056 01/31/19 0510 01/31/19 0600  BP: 121/63 131/63 111/64   Pulse: 62 78 66   Resp: 17 14 16    Temp: 98.7 F (37.1 C) 98.9 F (37.2 C) 98.2 F (36.8 C)   TempSrc: Oral Oral Oral   SpO2: 100% 100% 100%   Weight:    77.1 kg    Intake/Output Summary (Last 24 hours) at 01/31/2019 1242 Last data filed at 01/30/2019 2100  Gross per 24 hour  Intake 440 ml  Output --  Net 440 ml     Wt Readings from Last 3 Encounters:  01/31/19 77.1 kg     Exam  General: Alert and oriented x 3, NAD  Eyes: PERRLA, EOMI, Anicteric Sclera,  HEENT:    Cardiovascular: S1 S2 auscultated, no murmurs, RRR  Respiratory: Clear to auscultation bilaterally,  Gastrointestinal: Soft, RUQ TTP, nondistended, + bowel sounds  Ext: no pedal edema bilaterally  Neuro: No new deficits  Musculoskeletal: No digital cyanosis, clubbing  Skin: No rashes  Psych: Normal affect and demeanor, alert and oriented x3    Data Reviewed:  I have personally reviewed following labs and imaging studies  Micro Results Recent Results (from the past 240 hour(s))  Culture, blood (routine x 2)     Status: None (Preliminary result)   Collection Time: 01/29/19  7:20 PM   Specimen: BLOOD  Result Value Ref Range Status   Specimen Description BLOOD RIGHT ANTECUBITAL  Final   Special  Requests   Final    BOTTLES DRAWN AEROBIC AND ANAEROBIC Blood Culture results may not be optimal due to an inadequate volume of blood received in culture bottles   Culture   Final    NO GROWTH 2 DAYS Performed at [facility], [address] [place], [place] [address]    Report Status PENDING  Incomplete  Culture, blood (routine x 2)     Status: None (Preliminary result)   Collection Time: 01/29/19  7:30 PM   Specimen: BLOOD  Result Value Ref Range Status   Specimen Description BLOOD LEFT ANTECUBITAL  Final   Special Requests   Final    BOTTLES DRAWN AEROBIC ONLY Blood Culture adequate volume   Culture   Final    NO GROWTH 2 DAYS Performed at [facility], [address] [place], [place] [address]    Report Status PENDING  Incomplete  SARS CORONAVIRUS 2 (TAT 6-12 HRS) Nasal Swab Aptima Multi Swab     Status: None   Collection Time: 01/29/19 10:16 PM   Specimen: Aptima Multi Swab; Nasal Swab  Result Value Ref Range Status   SARS Coronavirus 2 NEGATIVE NEGATIVE Final    Comment: (NOTE) SARS-CoV-2 target nucleic acids are NOT DETECTED. The SARS-CoV-2 RNA is generally detectable in upper and lower respiratory specimens during the acute phase of infection. Negative results do not preclude SARS-CoV-2 infection, do not rule out co-infections with other pathogens, and should not be used as the sole basis for treatment or other patient management decisions. Negative results must be combined with clinical observations, patient history, and epidemiological information. The expected result is Negative. Fact Sheet for Patients: HairSlick.nohttps://www.fda.gov/media/138098/download Fact Sheet for Healthcare Providers: quierodirigir.comhttps://www.fda.gov/media/138095/download This test is not yet approved or cleared by the [place] FDA and  has been authorized for detection and/or diagnosis of SARS-CoV-2 by FDA under an Emergency Use Authorization (EUA). This EUA will remain  in effect (meaning this  test can be used) for the duration of the COVID-19 declaration under Section 56 4(b)(1) of the Act, 21 U.S.C. section 360bbb-3(b)(1), unless the authorization is terminated or revoked sooner. Performed at [facility], [address] [place], [place] [address]     Radiology Reports Dg Chest Portable 1 View  Result Date: 01/29/2019 CLINICAL DATA:  Acute shortness of breath. EXAM: PORTABLE CHEST 1 VIEW COMPARISON:  None. FINDINGS: The cardiomediastinal silhouette is unremarkable. There is no evidence of focal airspace disease, pulmonary  edema, suspicious pulmonary nodule/mass, pleural effusion, or pneumothorax. No acute bony abnormalities are identified. IMPRESSION: No active disease. Electronically Signed   By: Margarette Canada M.D.   On: 01/29/2019 19:46   US Abdomen Limited Ruq  Result Date: 01/29/2019 CLINICAL DATA:  28 year old male with jaundice, elevated LFTs and vomiting. EXAM: ULTRASOUND ABDOMEN LIMITED RIGHT UPPER QUADRANT COMPARISON:  None. FINDINGS: Gallbladder: Gallbladder is contracted. Apparent wall thickening could be secondary to contraction. No evidence of sonographic Murphy sign or pericholecystic fluid. No definite cholelithiasis noted. Common bile duct: Diameter: 4 mm. No evidence of intrahepatic or extrahepatic biliary dilatation. Liver: No focal lesion identified. Within normal limits in parenchymal echogenicity. Portal vein is patent on color Doppler imaging with normal direction of blood flow towards the liver. Other: None. IMPRESSION: 1. Apparent wall thickening of the gallbladder which may be secondary to contracted state. No definite cholelithiasis or other signs of acute cholecystitis. 2. Unremarkable liver.  No biliary dilatation. Electronically Signed   By: Margarette Canada M.D.   On: 01/29/2019 20:44    Lab Data:  CBC: Recent Labs  Lab 01/29/19 1852 01/30/19 0230  WBC 5.4 3.6*  HGB 14.4 12.2*  HCT 43.0 36.6*  MCV 90.7 89.3  PLT 124* 993*   Basic Metabolic  Panel: Recent Labs  Lab 01/29/19 1852 01/30/19 0230  NA 135 136  K 3.9 3.6  CL 98 100  CO2 29 28  GLUCOSE 117* 108*  BUN 7 6  CREATININE 0.78 0.69  CALCIUM 8.7* 8.2*   GFR: CrCl cannot be calculated (Unknown ideal weight.). Liver Function Tests: Recent Labs  Lab 01/29/19 1852 01/30/19 0230 01/31/19 0209  AST 1,731* 1,332* 1,276*  ALT 2,281* 1,854* 1,921*  ALKPHOS 358* 317* 341*  BILITOT 7.7* 6.3* 7.2*  PROT 6.4* 5.3* 6.3*  ALBUMIN 3.3* 2.6* 2.9*   Recent Labs  Lab 01/29/19 1852  LIPASE 31   Recent Labs  Lab 01/30/19 0831  AMMONIA 35   Coagulation Profile: Recent Labs  Lab 01/29/19 1852 01/30/19 0230 01/31/19 0209  INR 1.1 1.2 1.2   Cardiac Enzymes: Recent Labs  Lab 01/29/19 1852  CKTOTAL 38*   BNP (last 3 results) No results for input(s): PROBNP in the last 8760 hours. HbA1C: No results for input(s): HGBA1C in the last 72 hours. CBG: No results for input(s): GLUCAP in the last 168 hours. Lipid Profile: No results for input(s): CHOL, HDL, LDLCALC, TRIG, CHOLHDL, LDLDIRECT in the last 72 hours. Thyroid Function Tests: Recent Labs    01/29/19 1852  TSH 0.946   Anemia Panel: Recent Labs    01/29/19 1852  FERRITIN 1,914*  TIBC 294  IRON 137   Urine analysis:    Component Value Date/Time   COLORURINE AMBER (A) 01/29/2019 2358   APPEARANCEUR CLEAR 01/29/2019 2358   LABSPEC 1.008 01/29/2019 2358   PHURINE 7.0 01/29/2019 [place] [facility] 01/29/2019 2358   HGBUR NEGATIVE 01/29/2019 2358   BILIRUBINUR MODERATE (A) 01/29/2019 2358   KETONESUR NEGATIVE 01/29/2019 2358   PROTEINUR NEGATIVE 01/29/2019 2358   NITRITE NEGATIVE 01/29/2019 2358   LEUKOCYTESUR NEGATIVE 01/29/2019 2358     Marcea Rojek M.D. [facility] 01/31/2019, 12:42 PM  Pager: [phone] Between 7am to 7pm - call Pager - [phone]  After 7pm go to [email or web address] - password [email or web address]  Call night coverage person covering after 7pm

## 2019-01-31 NOTE — Progress Notes (Signed)
Pt compained that IV site is hurting and want SL taken out. NSL d/c'd pet pt requests. Refusing restart, site change at this time.

## 2019-01-31 NOTE — Progress Notes (Signed)
Pt refusing his Lovenox, explained its role in the prevention of blood clots.

## 2019-02-01 DIAGNOSIS — B159 Hepatitis A without hepatic coma: Secondary | ICD-10-CM

## 2019-02-01 DIAGNOSIS — D696 Thrombocytopenia, unspecified: Secondary | ICD-10-CM

## 2019-02-01 DIAGNOSIS — R7989 Other specified abnormal findings of blood chemistry: Secondary | ICD-10-CM

## 2019-02-01 DIAGNOSIS — R945 Abnormal results of liver function studies: Secondary | ICD-10-CM

## 2019-02-01 DIAGNOSIS — R17 Unspecified jaundice: Secondary | ICD-10-CM

## 2019-02-01 LAB — CBC
HCT: 40.2 % (ref 39.0–52.0)
Hemoglobin: 13.8 g/dL (ref 13.0–17.0)
MCH: 30.1 pg (ref 26.0–34.0)
MCHC: 34.3 g/dL (ref 30.0–36.0)
MCV: 87.6 fL (ref 80.0–100.0)
Platelets: 158 10*3/uL (ref 150–400)
RBC: 4.59 MIL/uL (ref 4.22–5.81)
RDW: 15.2 % (ref 11.5–15.5)
WBC: 5.6 10*3/uL (ref 4.0–10.5)
nRBC: 0 % (ref 0.0–0.2)

## 2019-02-01 LAB — COMPREHENSIVE METABOLIC PANEL
ALT: 1461 U/L — ABNORMAL HIGH (ref 0–44)
AST: 671 U/L — ABNORMAL HIGH (ref 15–41)
Albumin: 2.7 g/dL — ABNORMAL LOW (ref 3.5–5.0)
Alkaline Phosphatase: 295 U/L — ABNORMAL HIGH (ref 38–126)
Anion gap: 7 (ref 5–15)
BUN: 6 mg/dL (ref 6–20)
CO2: 29 mmol/L (ref 22–32)
Calcium: 8.6 mg/dL — ABNORMAL LOW (ref 8.9–10.3)
Chloride: 101 mmol/L (ref 98–111)
Creatinine, Ser: 0.85 mg/dL (ref 0.61–1.24)
GFR calc Af Amer: >60 mL/min (ref >60)
GFR calc non Af Amer: >60 mL/min (ref >60)
Glucose, Bld: 88 mg/dL (ref 70–99)
Potassium: 4.3 mmol/L (ref 3.5–5.1)
Sodium: 137 mmol/L (ref 135–145)
Total Bilirubin: 7.5 mg/dL — ABNORMAL HIGH (ref 0.3–1.2)
Total Protein: 5.9 g/dL — ABNORMAL LOW (ref 6.5–8.1)

## 2019-02-01 LAB — GLUCOSE, CAPILLARY: Glucose-Capillary: 101 mg/dL — ABNORMAL HIGH (ref 70–99)

## 2019-02-01 LAB — PROTIME-INR
INR: 1.2 (ref 0.8–1.2)
Prothrombin Time: 15 seconds (ref 11.4–15.2)

## 2019-02-01 MED ORDER — DOCUSATE SODIUM 100 MG PO CAPS: 100.0000 mg | ORAL_CAPSULE | Freq: Every day | ORAL | Status: DC

## 2019-02-01 MED ORDER — TRAZODONE HCL 50 MG PO TABS
50.0000 mg | ORAL_TABLET | Freq: Every evening | ORAL | Status: DC | PRN
  Administered 2019-02-01: 23:00:00 50 mg via ORAL
  Filled 2019-02-01: qty 1

## 2019-02-01 MED ORDER — SENNOSIDES-DOCUSATE SODIUM 8.6-50 MG PO TABS
1.0000 | ORAL_TABLET | Freq: Every day | ORAL | Status: DC
  Administered 2019-02-01 – 2019-02-02 (×2): 1 via ORAL
  Filled 2019-02-01 (×2): qty 1

## 2019-02-01 NOTE — Progress Notes (Signed)
PROGRESS NOTE    Cameron Carroll  ZOX:[id number] DOB: 06/18/1990 DOA: 01/29/2019 PCP: Patient, No Pcp Per   Brief Narrative:  HPI On 01/29/2019 by Dr. Koren BoundAdam Schertz Cameron Carroll is a 28 y.o. male with medical history significant for IVDU who presented to the ED with 2 to 3 days of yellowing of the eyes and skin, along with dark urine and mild fatigue.  Patient reports that 1 to 2 weeks ago, he had an episode of nausea and abdominal pain that lasted approximately 1 day and then spontaneously resolved.  He also reports anosmia and dysgeusia approximately 2 weeks ago that has also since resolved.  He states that he is a frequent user of IV opiates, his last use was 3 days ago.  He does not have a known history of hepatitis nor does he have a family history of liver disease.  He denies recent antibiotic use, denies any over-the-counter medications or supplements with the exception of an occasional BC powder.  He denies recent fever, rash.  No similar symptoms in any close contacts.  He denies significant alcohol use.  Assessment & Plan   Acute viral hepatitis with acute transaminitis -Hepatitis viral panel positive for hepatitis A IgM -HCV RNA quant: not detected -Currently patient is alert and oriented -LFTs are trending downward however total bilirubin slightly up today, 7.5 -Gastroenterology consulted and appreciated  History of drug abuse -Patient admits to using Suboxone (was obtaining this without a prescription) -Suboxone currently held due to liver dysfunction -Patient started on methadone -Continue IV morphine for severe or breakthrough pain -Discussed drug cessation, patient wants to know more about detox programs -Social work consulted  Thrombocytopenia -mild -Resolved  DVT Prophylaxis  lovenox  Code Status: Full  Family Communication: none at bedside  Disposition Plan: Admitted. Pending further GI recommendations  Consultants Gastroenterology  Procedures   RUQ US  Antibiotics   Anti-infectives (From admission, onward)   None      Subjective:   Cameron Carroll seen and examined today.  Still having some right upper quadrant pain when he takes a deep breath.  Denies current chest pain, shortness of breath, nausea or vomiting, diarrhea or constipation, dizziness or headache.    Objective:   Vitals:   01/31/19 0600 01/31/19 1517 01/31/19 2042 02/01/19 0626  BP:  113/60 102/62 98/61  Pulse:  64 72 66  Resp:  18 18 18   Temp:  98.3 F (36.8 C) 98.5 F (36.9 C) 98.3 F (36.8 C)  TempSrc:  Oral Oral Oral  SpO2:  99% 100% 97%  Weight: 77.1 kg       Intake/Output Summary (Last 24 hours) at 02/01/2019 0946 Last data filed at 01/31/2019 2300 Gross per 24 hour  Intake 680 ml  Output -  Net 680 ml   Filed Weights   01/31/19 0600  Weight: 77.1 kg    Exam  General: Well developed, well nourished, NAD, appears stated age  HEENT: NCAT, mucous membranes moist.   Cardiovascular: S1 S2 auscultated, RRR, no murmur  Respiratory: Clear to auscultation bilaterally   Abdomen: Soft, RUQ TTP, nondistended, + bowel sounds  Extremities: warm dry without cyanosis clubbing or edema  Neuro: AAOx3, nonfocal  Psych: Appropriate mood and affect   Data Reviewed: I have personally reviewed following labs and imaging studies  CBC: Recent Labs  Lab 01/29/19 1852 01/30/19 0230 02/01/19 0153  WBC 5.4 3.6* 5.6  HGB 14.4 12.2* 13.8  HCT 43.0 36.6* 40.2  MCV 90.7 89.3 87.6  PLT 124* 104* 941   Basic Metabolic Panel: Recent Labs  Lab 01/29/19 1852 01/30/19 0230 02/01/19 0153  NA 135 136 137  K 3.9 3.6 4.3  CL 98 100 101  CO2 29 28 29   GLUCOSE 117* 108* 88  BUN 7 6 6   CREATININE 0.78 0.69 0.85  CALCIUM 8.7* 8.2* 8.6*   GFR: CrCl cannot be calculated (Unknown ideal weight.). Liver Function Tests: Recent Labs  Lab 01/29/19 1852 01/30/19 0230 01/31/19 0209 02/01/19 0153  AST 1,731* 1,332* 1,276* 671*  ALT 2,281* 1,854* 1,921*  1,461*  ALKPHOS 358* 317* 341* 295*  BILITOT 7.7* 6.3* 7.2* 7.5*  PROT 6.4* 5.3* 6.3* 5.9*  ALBUMIN 3.3* 2.6* 2.9* 2.7*   Recent Labs  Lab 01/29/19 1852  LIPASE 31   Recent Labs  Lab 01/30/19 0831  AMMONIA 35   Coagulation Profile: Recent Labs  Lab 01/29/19 1852 01/30/19 0230 01/31/19 0209 02/01/19 0153  INR 1.1 1.2 1.2 1.2   Cardiac Enzymes: Recent Labs  Lab 01/29/19 1852  CKTOTAL 38*   BNP (last 3 results) No results for input(s): PROBNP in the last 8760 hours. HbA1C: No results for input(s): HGBA1C in the last 72 hours. CBG: Recent Labs  Lab 02/01/19 0748  GLUCAP 101*   Lipid Profile: No results for input(s): CHOL, HDL, LDLCALC, TRIG, CHOLHDL, LDLDIRECT in the last 72 hours. Thyroid Function Tests: Recent Labs    01/29/19 1852  TSH 0.946   Anemia Panel: Recent Labs    01/29/19 1852  FERRITIN 1,914*  TIBC 294  IRON 137   Urine analysis:    Component Value Date/Time   COLORURINE AMBER (A) 01/29/2019 2358   APPEARANCEUR CLEAR 01/29/2019 2358   LABSPEC 1.008 01/29/2019 2358   PHURINE 7.0 01/29/2019 2358   GLUCOSEU NEGATIVE 01/29/2019 2358   HGBUR NEGATIVE 01/29/2019 2358   BILIRUBINUR MODERATE (A) 01/29/2019 2358   KETONESUR NEGATIVE 01/29/2019 2358   PROTEINUR NEGATIVE 01/29/2019 2358   NITRITE NEGATIVE 01/29/2019 2358   LEUKOCYTESUR NEGATIVE 01/29/2019 2358   Sepsis Labs: @LABRCNTIP (procalcitonin:4,lacticidven:4)  ) Recent Results (from the past 240 hour(s))  Culture, blood (routine x 2)     Status: None (Preliminary result)   Collection Time: 01/29/19  7:20 PM   Specimen: BLOOD  Result Value Ref Range Status   Specimen Description BLOOD RIGHT ANTECUBITAL  Final   Special Requests   Final    BOTTLES DRAWN AEROBIC AND ANAEROBIC Blood Culture results may not be optimal due to an inadequate volume of blood received in culture bottles   Culture   Final    NO GROWTH 2 DAYS Performed at [facility], [place] [address],  [place], [place] [address]    Report Status PENDING  Incomplete  Culture, blood (routine x 2)     Status: None (Preliminary result)   Collection Time: 01/29/19  7:30 PM   Specimen: BLOOD  Result Value Ref Range Status   Specimen Description BLOOD LEFT ANTECUBITAL  Final   Special Requests   Final    BOTTLES DRAWN AEROBIC ONLY Blood Culture adequate volume   Culture   Final    NO GROWTH 2 DAYS Performed at [facility], [place] [address] [place], [place] [address]    Report Status PENDING  Incomplete  SARS CORONAVIRUS 2 (TAT 6-12 HRS) Nasal Swab Aptima Multi Swab     Status: None   Collection Time: 01/29/19 10:16 PM   Specimen: Aptima Multi Swab; Nasal Swab  Result Value Ref Range Status  SARS Coronavirus 2 NEGATIVE NEGATIVE Final    Comment: (NOTE) SARS-CoV-2 target nucleic acids are NOT DETECTED. The SARS-CoV-2 RNA is generally detectable in upper and lower respiratory specimens during the acute phase of infection. Negative results do not preclude SARS-CoV-2 infection, do not rule out co-infections with other pathogens, and should not be used as the sole basis for treatment or other patient management decisions. Negative results must be combined with clinical observations, patient history, and epidemiological information. The expected result is Negative. Fact Sheet for Patients: HairSlick.no Fact Sheet for Healthcare Providers: quierodirigir.com This test is not yet approved or cleared by the [place] FDA and  has been authorized for detection and/or diagnosis of SARS-CoV-2 by FDA under an Emergency Use Authorization (EUA). This EUA will remain  in effect (meaning this test can be used) for the duration of the COVID-19 declaration under Section 56 4(b)(1) of the Act, 21 U.S.C. section 360bbb-3(b)(1), unless the authorization is terminated or revoked sooner. Performed at [facility], [address],  [place], [place] [address]       Radiology Studies: No results found.   Scheduled Meds: . enoxaparin (LOVENOX) injection  40 mg Subcutaneous Daily  . folic acid  1 mg Oral Daily  . methadone  25 mg Oral Q12H  . multivitamin with minerals  1 tablet Oral Daily  . nicotine  21 mg Transdermal Daily  . thiamine  100 mg Oral Daily   Continuous Infusions:   LOS: 3 days   Time Spent in minutes   30 minutes  Render Marley D.O. on 02/01/2019 at 9:46 AM  Between 7am to 7pm - Please see pager noted on [email or web address]  After 7pm go to [email or web address]  And look for the night coverage person covering for me after hours  [facility] Office  [phone]

## 2019-02-01 NOTE — TOC Initial Note (Signed)
Transition of Care (TOC) - Initial/Assessment Note    Patient Details  Name: Cameron Carroll MRN: [id number] Date of Birth: 09/04/1990  Transition of Care (TOC) CM/SW Contact:    Isabel H Chasse, LCSWA Phone Number: 02/01/2019, 10:40 AM  Clinical Narrative:                 CSW met with pt at bedside. Woke pt and introduced self, role, reason for visit. Pt has been staying with friends and essentially couch surfing. At discharge he is worried that he does not have a solid place to go; he has Medicaid and utilizes the bus to get around town. He is originally from [place] and admits to using multiple substances. He has been through a rehab program in [place] in the past. He is willing for CSW to call housing hotline and for resources regarding substance cessation programs.   CSW will f/u with these resources and call [place] [facility] [employer] hotline. Pt does understand that resources are limited due to the current pandemic and CSW encouraged him to try and reach out to any family or friends that he may be able to stay with at discharge. Per pt, he would like his contact on FaceSheet to remain his significant other.   Expected Discharge Plan: Homeless Shelter Barriers to Discharge: Continued Medical Work up, Homeless with medical needs   Patient Goals and CMS Choice Patient states their goals for this hospitalization and ongoing recovery are:: to find more stable housing CMS Medicare.gov Compare Post Acute Care list provided to:: (n/a) Choice offered to / list presented to : Patient  Expected Discharge Plan and Services Expected Discharge Plan: Homeless Shelter In-house Referral: Clinical Social Work Discharge Planning Services: CM Consult, MATCH Program, Medication Assistance, Follow-up appt scheduled Post Acute Care Choice: NA Living arrangements for the past 2 months: Apartment, No permanent address                   Prior Living Arrangements/Services Living  arrangements for the past 2 months: Apartment, No permanent address Lives with:: Self Patient language and need for interpreter reviewed:: Yes(no needs) Do you feel safe going back to the place where you live?: No   no permanent housing  Need for Family Participation in Patient Care: No (Comment)(would benefit from assistance and support) Care giver support system in place?: No (comment)   Criminal Activity/Legal Involvement Pertinent to Current Situation/Hospitalization: No - Comment as needed  Activities of Daily Living Home Assistive Devices/Equipment: None ADL Screening (condition at time of admission) Patient's cognitive ability adequate to safely complete daily activities?: Yes Is the patient deaf or have difficulty hearing?: No Does the patient have difficulty seeing, even when wearing glasses/contacts?: No Does the patient have difficulty concentrating, remembering, or making decisions?: No Patient able to express need for assistance with ADLs?: Yes Does the patient have difficulty dressing or bathing?: No Independently performs ADLs?: Yes (appropriate for developmental age) Does the patient have difficulty walking or climbing stairs?: No Weakness of Legs: None Weakness of Arms/Hands: None  Permission Sought/Granted Permission sought to share information with : Facility Contact Representative, Other (comment) Permission granted to share information with : Yes, Verbal Permission Granted     Permission granted to share info w AGENCY: Housing Coalition Hotline        Emotional Assessment Appearance:: Appears stated age Attitude/Demeanor/Rapport: Engaged, Gracious Affect (typically observed): Accepting, Adaptable, Appropriate, Quiet Orientation: : Oriented to Self, Oriented to Place, Oriented to  Time, Oriented to Situation Alcohol /   Substance Use: Illicit Drugs, Alcohol Use, Tobacco Use Psych Involvement: No (comment)  Admission diagnosis:  Jaundice [R17] Elevated LFTs  [R94.5] Patient Active Problem List   Diagnosis Date Noted  . Acute hepatitis 01/29/2019   PCP:  Patient, No Pcp Per Pharmacy:   [facility], [place]. [place] [address] Phone: [phone] Fax: [phone]     Social Determinants of Health (SDOH) Interventions    Readmission Risk Interventions No flowsheet data found.

## 2019-02-01 NOTE — Progress Notes (Signed)
          Daily Rounding Note  02/01/2019, 10:21 AM  LOS: 3 days   SUBJECTIVE:   Chief complaint: Acute hepatitis A.    Minor discomfort in right upper quadrant with deep breathing, twisting.  Appetite improved.  No postprandial pain.  Although having small bowel movements daily, feels somewhat constipated.  OBJECTIVE:         Vital signs in last 24 hours:    Temp:  [98.3 F (36.8 C)-98.5 F (36.9 C)] 98.3 F (36.8 C) (09/02 0626) Pulse Rate:  [64-72] 66 (09/02 0626) Resp:  [18] 18 (09/02 0626) BP: (98-113)/(60-62) 98/61 (09/02 0626) SpO2:  [97 %-100 %] 97 % (09/02 0626) Last BM Date: 01/31/19 Filed Weights   01/31/19 0600  Weight: 77.1 kg   General: Looks well.  Not jaundiced. Heart: RRR.  No MRG.  S1, S2 present. Chest: No labored breathing or cough.  Lungs clear bilaterally. Abdomen: Soft.  Not distended.  Mild tenderness in right upper quadrant.  No guarding or rebound.  No masses, HSM, bruits, hernias. Extremities: CCE. Neuro/Psych: Oriented x3.  No tremor or asterixis.  No gross deficits, no limb weakness.  Intake/Output from previous day: 09/01 0701 - 09/02 0700 In: 800 [P.O.:800] Out: -   Intake/Output this shift: No intake/output data recorded.  Lab Results: Recent Labs    01/29/19 1852 01/30/19 0230 02/01/19 0153  WBC 5.4 3.6* 5.6  HGB 14.4 12.2* 13.8  HCT 43.0 36.6* 40.2  PLT 124* 104* 158   BMET Recent Labs    01/29/19 1852 01/30/19 0230 02/01/19 0153  NA 135 136 137  K 3.9 3.6 4.3  CL 98 100 101  CO2 29 28 29   GLUCOSE 117* 108* 88  BUN 7 6 6   CREATININE 0.78 0.69 0.85  CALCIUM 8.7* 8.2* 8.6*   LFT Recent Labs    01/29/19 1852 01/30/19 0230 01/31/19 0209 02/01/19 0153  PROT 6.4* 5.3* 6.3* 5.9*  ALBUMIN 3.3* 2.6* 2.9* 2.7*  AST 1,731* 1,332* 1,276* 671*  ALT 2,281* 1,854* 1,921* 1,461*  ALKPHOS 358* 317* 341* 295*  BILITOT 7.7* 6.3* 7.2* 7.5*  BILIDIR 4.8*  --  4.7*  --   IBILI   --   --  2.5*  --    PT/INR Recent Labs    01/31/19 0209 02/01/19 0153  LABPROT 14.7 15.0  INR 1.2 1.2   Hepatitis Panel Recent Labs    01/30/19 1516  HEPBSAG Negative  HCVAB <0.1  HEPAIGM Positive*  HEPBIGM Negative    Studies/Results: No results found.  ASSESMENT:   *   Acute hepatitis A.  HCV quant, no virus detected. PT/INR normal.  Ammonia normal Slightly low ceruloplasmin at 11.6. Nonspecific gallbladder wall thickening and unremarkable liver on 01/29/2019 ultrasound. LFTs steadily improving  *   Non-critical thrombocytopenia resolved. Leukopenia, resolved.    *    IV heroin use.    Using suboxone without Rx in past.  Now on Methadone   PLAN   *   Strongly consider vaccination against hepatitis B.    *   Does he need urine copper collection for the mildly low ceruloplasmin?     *    Added daily Senokot for mild, opiate-induced constipation.      Azucena Freed  02/01/2019, 10:21 AM Phone [phone]

## 2019-02-02 ENCOUNTER — Encounter (HOSPITAL_COMMUNITY): Payer: Self-pay | Admitting: Surgery

## 2019-02-02 ENCOUNTER — Encounter (HOSPITAL_COMMUNITY): Payer: Self-pay

## 2019-02-02 LAB — COMPREHENSIVE METABOLIC PANEL
ALT: 1039 U/L — ABNORMAL HIGH (ref 0–44)
AST: 334 U/L — ABNORMAL HIGH (ref 15–41)
Albumin: 2.6 g/dL — ABNORMAL LOW (ref 3.5–5.0)
Alkaline Phosphatase: 293 U/L — ABNORMAL HIGH (ref 38–126)
Anion gap: 9 (ref 5–15)
BUN: 11 mg/dL (ref 6–20)
CO2: 26 mmol/L (ref 22–32)
Calcium: 8.4 mg/dL — ABNORMAL LOW (ref 8.9–10.3)
Chloride: 100 mmol/L (ref 98–111)
Creatinine, Ser: 0.86 mg/dL (ref 0.61–1.24)
GFR calc Af Amer: >60 mL/min (ref >60)
GFR calc non Af Amer: >60 mL/min (ref >60)
Glucose, Bld: 98 mg/dL (ref 70–99)
Potassium: 4.2 mmol/L (ref 3.5–5.1)
Sodium: 135 mmol/L (ref 135–145)
Total Bilirubin: 6.7 mg/dL — ABNORMAL HIGH (ref 0.3–1.2)
Total Protein: 5.9 g/dL — ABNORMAL LOW (ref 6.5–8.1)

## 2019-02-02 LAB — CBC
HCT: 38.3 % — ABNORMAL LOW (ref 39.0–52.0)
Hemoglobin: 12.7 g/dL — ABNORMAL LOW (ref 13.0–17.0)
MCH: 29.9 pg (ref 26.0–34.0)
MCHC: 33.2 g/dL (ref 30.0–36.0)
MCV: 90.1 fL (ref 80.0–100.0)
Platelets: 169 10*3/uL (ref 150–400)
RBC: 4.25 MIL/uL (ref 4.22–5.81)
RDW: 15.8 % — ABNORMAL HIGH (ref 11.5–15.5)
WBC: 6.9 10*3/uL (ref 4.0–10.5)
nRBC: 0 % (ref 0.0–0.2)

## 2019-02-02 MED ORDER — NICOTINE 21 MG/24HR TD PT24: 21.0000 mg | MEDICATED_PATCH | Freq: Every day | TRANSDERMAL | 0 refills | Status: AC

## 2019-02-02 MED ORDER — FOLIC ACID 1 MG PO TABS: 1.0000 mg | ORAL_TABLET | Freq: Every day | ORAL | Status: AC

## 2019-02-02 MED ORDER — ADULT MULTIVITAMIN W/MINERALS CH: 1.0000 | ORAL_TABLET | Freq: Every day | ORAL | Status: AC

## 2019-02-02 MED ORDER — THIAMINE HCL 100 MG PO TABS: 100.0000 mg | ORAL_TABLET | Freq: Every day | ORAL | Status: AC

## 2019-02-02 NOTE — Discharge Instructions (Signed)
Hepatitis A Hepatitis A is a liver infection that is caused by a virus. Most cases are mild. Hepatitis A is contagious. This means that it can spread from person to person. You can get this disease by:  Drinking or eating unclean (contaminated) food or water.  Having sex with someone who is infected.  Coming into contact with the poop (feces) of a person who is infected. You may then pass the virus from your hands to your mouth. Follow these instructions at home: Medicines  Take over-the-counter and prescription medicines only as told by your doctor.  Do not take any over-the-counter medicines that have acetaminophen.  Do not take any new medicines unless your doctor says that this is okay. This includes over-the-counter medicines and supplements. Lifestyle   Rest. Make sure you: ? Get enough sleep. Avoid staying up late. ? Keep the same bedtime hours on weekends and weekdays. ? Take naps or breaks when you feel tired.  Do not have sex unless your doctor says that this is okay.  Eat a balanced diet. Eat plenty of: ? Fruits and vegetables. ? Whole grains. ? Low-fat (lean) meats or non-meat proteins, such as beans or tofu.  Do not drink alcohol until your doctor says that this is okay. General instructions  Wash your hands often with soap and water. If you do not have soap and water, use hand sanitizer. Make sure to wash your hands: ? After using the bathroom. ? Before touching food or water. ? After changing diapers.  Tell your doctor about people you live with or have close contact with. They may need the hepatitis A vaccine.  Follow instructions about how to avoid spreading the virus.  Ask your doctor when you may go back to school or work.  Keep all follow-up visits as told by your doctor. This is important. How is this prevented?  Get the hepatitis A vaccine.  If you were recently exposed to the virus, you may need a shot of the human immunoglobulin or the  hepatitis A vaccine. This may help protect you against hepatitis A.  Wash your hands often with soap and water. If you do not have soap and water, use hand sanitizer. Make sure you wash your hands: ? After using the bathroom. ? After changing diapers. ? Before touching food or water.  If you travel to a developing country: ? Avoid food that is raw or undercooked. ? Drink bottled water. ? Use bottled water to brush your teeth, make ice, and wash foods.  Practice safe sex. Always use condoms when having oral, vaginal, or anal sex. Contact a doctor if:  You have a fever.  Your health problems get worse. Get help right away if:  You cannot eat or drink.  You cannot eat or drink without throwing up (vomiting).  You feel confused.  You have yellowing of the skin and the whites of your eyes (jaundice) that gets worse.  You are very sleepy.  You have trouble waking up.  You have bleeding that you cannot stop.  You get a lot of bruises for no reason. Summary  Hepatitis A is a liver infection caused by a virus.  The virus can be spread by eating unclean food or drinking unclean water.  Do not take any new medicines unless your doctor says that this is okay.  Wash your hands often with soap and water. This helps to prevent infection. This information is not intended to replace advice given to you by  your health care provider. Make sure you discuss any questions you have with your health care provider. Document Released: 06/20/2010 Document Revised: 04/30/2017 Document Reviewed: 06/23/2016 Elsevier Patient Education  2020 [employer].

## 2019-02-02 NOTE — Discharge Summary (Addendum)
Physician Discharge Summary  Cameron Carroll ZOX:[id number] DOB: 06/23/1990 DOA: 01/29/2019  PCP: Patient, No Pcp Per  Admit date: 01/29/2019 Discharge date: 02/02/2019  Time spent: 45 minutes  Recommendations for Outpatient Follow-up:  Patient will be discharged to home.  Patient will need to follow up with primary care provider within one week of discharge, repeat CBC and CMP.  Patient should continue medications as prescribed.  Patient should follow a regular diet.   Discharge Diagnoses:  Acute viral hepatitis with acute transaminitis History of drug abuse Thrombocytopenia  Discharge Condition: Stable  Diet recommendation: regular  Filed Weights   01/31/19 0600  Weight: 77.1 kg    History of present illness:  On 01/29/2019 by Dr. Koren BoundAdam Schertz Cameron Robert Owensis a 28 y.o.malewith medical history significant forIVDUwho presented to the ED with 2 to 3 days of yellowing of the eyes and skin, along with dark urine and mild fatigue. Patient reports that 1 to 2 weeks ago, he had an episode of nausea and abdominal pain that lasted approximately 1 day and then spontaneously resolved. He also reports anosmia and dysgeusia approximately 2 weeks ago that has also since resolved. He states that he is a frequent user of IV opiates, his last use was 3 days ago. He does not have a known history of hepatitis nor does he have a family history of liver disease. He denies recent antibiotic use, denies any over-the-counter medications or supplements with the exception of an occasional BC powder. He denies recent fever, rash. No similar symptoms in anyclose contacts. He denies significant alcohol use.  Hospital Course:  Acute viral hepatitis with acute transaminitis -Hepatitis viral panel positive for hepatitis A IgM -HCV RNA quant: not detected -Currently patient is alert and oriented -LFTs and bilirubmin are trending downward -Gastroenterology consulted and appreciated -repeat  CMP in one week -per GI, repeat ceruloplasmin levels when acute illness resovles  History of drug abuse -Patient admits to using Suboxone (was obtaining this without a prescription) -Suboxone currently held due to liver dysfunction -Patient started on methadone -Continue IV morphine for severe or breakthrough pain -Discussed drug cessation, patient wants to know more about detox programs -Social work consulted -will not discharge patient with medications  Thrombocytopenia -mild -Resolved  Procedures: RUQ US  Consultations: Gastroenterology  Discharge Exam: Vitals:   02/01/19 2153 02/02/19 0616  BP: 100/60 97/60  Pulse: 61 69  Resp: 17 17  Temp: 98.1 F (36.7 C) 98.7 F (37.1 C)  SpO2: 98% 97%     General: Well developed, well nourished, NAD  HEENT: NCAT, Scleral icterus, mucous membranes moist.  Cardiovascular: S1 S2 auscultated, RRR, no murmur  Respiratory: Clear to auscultation bilaterally   Abdomen: Soft, mild RUQ TTP, nondistended, + bowel sounds  Extremities: warm dry without cyanosis clubbing or edema  Neuro: AAOx3, nonfocal  Psych: Appropriate mood and affect, pleasant   Discharge Instructions Discharge Instructions    Discharge instructions   Complete by: As directed    Patient will be discharged to home.  Patient will need to follow up with primary care provider within one week of discharge, repeat CBC and CMP.  Patient should continue medications as prescribed.  Patient should follow a regular diet.     Allergies as of 02/02/2019      Reactions   Other Anaphylaxis, Nausea And Vomiting   Can tuna and tree nuts   Pistachio Nut Extract Skin Test Anaphylaxis      Medication List    STOP taking these medications  SUBOXONE SL     TAKE these medications   folic acid 1 MG tablet Commonly known as: FOLVITE Take 1 tablet (1 mg total) by mouth daily. Start taking on: February 03, 2019   multivitamin with minerals Tabs tablet Take 1 tablet  by mouth daily. Start taking on: February 03, 2019   nicotine 21 mg/24hr patch Commonly known as: NICODERM CQ - dosed in mg/24 hours Place 1 patch (21 mg total) onto the skin daily. Start taking on: February 03, 2019   thiamine 100 MG tablet Take 1 tablet (100 mg total) by mouth daily. Start taking on: February 03, 2019      Allergies  Allergen Reactions  . Other Anaphylaxis and Nausea And Vomiting    Can tuna and tree nuts  . Pistachio Nut Extract Skin Test Anaphylaxis   Follow-up Information    Tressia Danas, MD. Schedule an appointment as soon as possible for a visit in 1 week(s).   Specialty: Gastroenterology Why: Hospital follow up Contact information: [address] [place] [address] [phone]            The results of significant diagnostics from this hospitalization (including imaging, microbiology, ancillary and laboratory) are listed below for reference.    Significant Diagnostic Studies: Dg Chest Portable 1 View  Result Date: 01/29/2019 CLINICAL DATA:  Acute shortness of breath. EXAM: PORTABLE CHEST 1 VIEW COMPARISON:  None. FINDINGS: The cardiomediastinal silhouette is unremarkable. There is no evidence of focal airspace disease, pulmonary edema, suspicious pulmonary nodule/mass, pleural effusion, or pneumothorax. No acute bony abnormalities are identified. IMPRESSION: No active disease. Electronically Signed   By: Harmon Pier M.D.   On: 01/29/2019 19:46   US Abdomen Limited Ruq  Result Date: 01/29/2019 CLINICAL DATA:  28 year old male with jaundice, elevated LFTs and vomiting. EXAM: ULTRASOUND ABDOMEN LIMITED RIGHT UPPER QUADRANT COMPARISON:  None. FINDINGS: Gallbladder: Gallbladder is contracted. Apparent wall thickening could be secondary to contraction. No evidence of sonographic Murphy sign or pericholecystic fluid. No definite cholelithiasis noted. Common bile duct: Diameter: 4 mm. No evidence of intrahepatic or extrahepatic biliary dilatation.  Liver: No focal lesion identified. Within normal limits in parenchymal echogenicity. Portal vein is patent on color Doppler imaging with normal direction of blood flow towards the liver. Other: None. IMPRESSION: 1. Apparent wall thickening of the gallbladder which may be secondary to contracted state. No definite cholelithiasis or other signs of acute cholecystitis. 2. Unremarkable liver.  No biliary dilatation. Electronically Signed   By: Harmon Pier M.D.   On: 01/29/2019 20:44    Microbiology: Recent Results (from the past 240 hour(s))  Culture, blood (routine x 2)     Status: None (Preliminary result)   Collection Time: 01/29/19  7:20 PM   Specimen: BLOOD  Result Value Ref Range Status   Specimen Description BLOOD RIGHT ANTECUBITAL  Final   Special Requests   Final    BOTTLES DRAWN AEROBIC AND ANAEROBIC Blood Culture results may not be optimal due to an inadequate volume of blood received in culture bottles   Culture   Final    NO GROWTH 3 DAYS Performed at [facility], [address] [place], [place] [address]    Report Status PENDING  Incomplete  Culture, blood (routine x 2)     Status: None (Preliminary result)   Collection Time: 01/29/19  7:30 PM   Specimen: BLOOD  Result Value Ref Range Status   Specimen Description BLOOD LEFT ANTECUBITAL  Final   Special Requests  Final    BOTTLES DRAWN AEROBIC ONLY Blood Culture adequate volume   Culture   Final    NO GROWTH 3 DAYS Performed at [facility], [place] [address] [place], [place] [address]    Report Status PENDING  Incomplete  SARS CORONAVIRUS 2 (TAT 6-12 HRS) Nasal Swab Aptima Multi Swab     Status: None   Collection Time: 01/29/19 10:16 PM   Specimen: Aptima Multi Swab; Nasal Swab  Result Value Ref Range Status   SARS Coronavirus 2 NEGATIVE NEGATIVE Final    Comment: (NOTE) SARS-CoV-2 target nucleic acids are NOT DETECTED. The SARS-CoV-2 RNA is generally detectable in upper and lower respiratory specimens  during the acute phase of infection. Negative results do not preclude SARS-CoV-2 infection, do not rule out co-infections with other pathogens, and should not be used as the sole basis for treatment or other patient management decisions. Negative results must be combined with clinical observations, patient history, and epidemiological information. The expected result is Negative. Fact Sheet for Patients: SugarRoll.be Fact Sheet for Healthcare Providers: https://www.woods-mathews.com/ This test is not yet approved or cleared by the [place] FDA and  has been authorized for detection and/or diagnosis of SARS-CoV-2 by FDA under an Emergency Use Authorization (EUA). This EUA will remain  in effect (meaning this test can be used) for the duration of the COVID-19 declaration under Section 56 4(b)(1) of the Act, 21 U.S.C. section 360bbb-3(b)(1), unless the authorization is terminated or revoked sooner. Performed at [facility], [place] [address], [place], [place] [address]      Labs: Basic Metabolic Panel: Recent Labs  Lab 01/29/19 1852 01/30/19 0230 02/01/19 0153 02/02/19 0246  NA 135 136 137 135  K 3.9 3.6 4.3 4.2  CL 98 100 101 100  CO2 29 28 29 26   GLUCOSE 117* 108* 88 98  BUN 7 6 6 11   CREATININE 0.78 0.69 0.85 0.86  CALCIUM 8.7* 8.2* 8.6* 8.4*   Liver Function Tests: Recent Labs  Lab 01/29/19 1852 01/30/19 0230 01/31/19 0209 02/01/19 0153 02/02/19 0246  AST 1,731* 1,332* 1,276* 671* 334*  ALT 2,281* 1,854* 1,921* 1,461* 1,039*  ALKPHOS 358* 317* 341* 295* 293*  BILITOT 7.7* 6.3* 7.2* 7.5* 6.7*  PROT 6.4* 5.3* 6.3* 5.9* 5.9*  ALBUMIN 3.3* 2.6* 2.9* 2.7* 2.6*   Recent Labs  Lab 01/29/19 1852  LIPASE 31   Recent Labs  Lab 01/30/19 0831  AMMONIA 35   CBC: Recent Labs  Lab 01/29/19 1852 01/30/19 0230 02/01/19 0153 02/02/19 0246  WBC 5.4 3.6* 5.6 6.9  HGB 14.4 12.2* 13.8 12.7*  HCT 43.0 36.6* 40.2  38.3*  MCV 90.7 89.3 87.6 90.1  PLT 124* 104* 158 169   Cardiac Enzymes: Recent Labs  Lab 01/29/19 1852  CKTOTAL 38*   BNP: BNP (last 3 results) No results for input(s): BNP in the last 8760 hours.  ProBNP (last 3 results) No results for input(s): PROBNP in the last 8760 hours.  CBG: Recent Labs  Lab 02/01/19 0748  GLUCAP 101*       Signed:  Cristal Ford  [facility] 02/02/2019, 10:24 AM

## 2019-02-02 NOTE — Progress Notes (Signed)
Patient discharged to home with instructions and was transported by [facility] to destination.

## 2019-02-02 NOTE — TOC Transition Note (Signed)
Transition of Care [facility]) - CM/SW Discharge Note   Patient Details  Name: [place] Robert Starlin MRN: [id number] Date of Birth: 11/18/1990  [facility] CM/SW Contact:  Alexander Mt, [place] Phone Number: 02/02/2019, 10:59 AM   Clinical Narrative:    CSW spoke with pt at bedside. We discussed that per call from [facility] that shelters for single adult males are all at capacity. Inpatient treatment programs that accept Medicaid are also operating off of a waitlist and that there is not available bed placement for today. We discussed resource list and substance use resources. Pt also given [employer] flyer with connections to employee support there. Pt did not reach out to anyone regarding housing yesterday nor has contacted anyone this morning. He is amenable to a cab voucher to the [facility] after lunch and mentions that he has a court date in [place] that he needs a letter for; he is unsure of the date but will attempt to find out the date and fax number for [place] to send letter.   CSW will update RN with this information.    Final next level of care: Homeless Shelter Barriers to Discharge: Barriers Resolved   Patient Goals and CMS Choice Patient states their goals for this hospitalization and ongoing recovery are:: to find more stable housing CMS Medicare.gov Compare Post Acute Care list provided to:: (n/a) Choice offered to / list presented to : Patient  Discharge Placement                Patient to be transferred to facility by: cab to [facility] Name of family member notified: pt declined    Discharge Plan and Services In-house Referral: Clinical Social Work Discharge Planning Services: CM Consult, [facility], Medication Assistance, Follow-up appt scheduled Post Acute Care Choice: NA                               Social Determinants of Health (SDOH) Interventions     Readmission Risk Interventions No flowsheet data found.

## 2019-02-03 LAB — CULTURE, BLOOD (ROUTINE X 2)
Culture: NO GROWTH
Culture: NO GROWTH
Special Requests: ADEQUATE

## 2019-03-22 ENCOUNTER — Emergency Department (HOSPITAL_COMMUNITY)
Admission: EM | Admit: 2019-03-22 | Discharge: 2019-03-22 | Discharge status: Absent without leave | Payer: Medicaid Other

## 2019-03-22 ENCOUNTER — Emergency Department (HOSPITAL_COMMUNITY)
Admission: EM | Admit: 2019-03-22 | Discharge: 2019-03-22 | Disposition: A | Discharge status: Home / Self Care | Payer: Medicaid Other | Attending: Emergency Medicine | Admitting: Emergency Medicine

## 2019-03-22 ENCOUNTER — Encounter (HOSPITAL_COMMUNITY): Payer: Self-pay

## 2019-03-22 ENCOUNTER — Other Ambulatory Visit: Payer: Self-pay

## 2019-03-22 DIAGNOSIS — Z79899 Other long term (current) drug therapy: Secondary | ICD-10-CM | POA: Insufficient documentation

## 2019-03-22 DIAGNOSIS — F1910 Other psychoactive substance abuse, uncomplicated: Secondary | ICD-10-CM | POA: Insufficient documentation

## 2019-03-22 DIAGNOSIS — F17210 Nicotine dependence, cigarettes, uncomplicated: Secondary | ICD-10-CM | POA: Diagnosis not present

## 2019-03-22 DIAGNOSIS — F1123 Opioid dependence with withdrawal: Secondary | ICD-10-CM | POA: Insufficient documentation

## 2019-03-22 DIAGNOSIS — F1193 Opioid use, unspecified with withdrawal: Secondary | ICD-10-CM

## 2019-03-22 DIAGNOSIS — R000 Tachycardia, unspecified: Secondary | ICD-10-CM | POA: Diagnosis present

## 2019-03-22 LAB — CBC WITH DIFFERENTIAL/PLATELET
Abs Immature Granulocytes: 0.02 10*3/uL (ref 0.00–0.07)
Basophils Absolute: 0 10*3/uL (ref 0.0–0.1)
Basophils Relative: 0 %
Eosinophils Absolute: 0.2 10*3/uL (ref 0.0–0.5)
Eosinophils Relative: 3 %
HCT: 44.4 % (ref 39.0–52.0)
Hemoglobin: 14.6 g/dL (ref 13.0–17.0)
Immature Granulocytes: 0 %
Lymphocytes Relative: 27 %
Lymphs Abs: 1.7 10*3/uL (ref 0.7–4.0)
MCH: 30.7 pg (ref 26.0–34.0)
MCHC: 32.9 g/dL (ref 30.0–36.0)
MCV: 93.5 fL (ref 80.0–100.0)
Monocytes Absolute: 0.5 10*3/uL (ref 0.1–1.0)
Monocytes Relative: 8 %
Neutro Abs: 3.9 10*3/uL (ref 1.7–7.7)
Neutrophils Relative %: 62 %
Platelets: 266 10*3/uL (ref 150–400)
RBC: 4.75 MIL/uL (ref 4.22–5.81)
RDW: 13.4 % (ref 11.5–15.5)
WBC: 6.4 10*3/uL (ref 4.0–10.5)
nRBC: 0 % (ref 0.0–0.2)

## 2019-03-22 LAB — RAPID URINE DRUG SCREEN, HOSP PERFORMED
Amphetamines: POSITIVE — AB
Barbiturates: NOT DETECTED
Benzodiazepines: NOT DETECTED
Cocaine: NOT DETECTED
Opiates: POSITIVE — AB
Tetrahydrocannabinol: POSITIVE — AB

## 2019-03-22 LAB — COMPREHENSIVE METABOLIC PANEL
ALT: 155 U/L — ABNORMAL HIGH (ref 0–44)
AST: 77 U/L — ABNORMAL HIGH (ref 15–41)
Albumin: 4.3 g/dL (ref 3.5–5.0)
Alkaline Phosphatase: 96 U/L (ref 38–126)
Anion gap: 8 (ref 5–15)
BUN: 12 mg/dL (ref 6–20)
CO2: 28 mmol/L (ref 22–32)
Calcium: 9.8 mg/dL (ref 8.9–10.3)
Chloride: 104 mmol/L (ref 98–111)
Creatinine, Ser: 0.87 mg/dL (ref 0.61–1.24)
GFR calc Af Amer: >60 mL/min (ref >60)
GFR calc non Af Amer: >60 mL/min (ref >60)
Glucose, Bld: 76 mg/dL (ref 70–99)
Potassium: 4.4 mmol/L (ref 3.5–5.1)
Sodium: 140 mmol/L (ref 135–145)
Total Bilirubin: 1 mg/dL (ref 0.3–1.2)
Total Protein: 8.3 g/dL — ABNORMAL HIGH (ref 6.5–8.1)

## 2019-03-22 LAB — ETHANOL: Alcohol, Ethyl (B): <10 mg/dL (ref <10)

## 2019-03-22 MED ORDER — HYDROXYZINE HCL 25 MG PO TABS: 25.0000 mg | ORAL_TABLET | Freq: Four times a day (QID) | ORAL | Status: DC | PRN

## 2019-03-22 MED ORDER — CLONIDINE HCL 0.1 MG PO TABS: 0.1000 mg | ORAL_TABLET | ORAL | Status: DC

## 2019-03-22 MED ORDER — CLONIDINE HCL 0.1 MG PO TABS
0.1000 mg | ORAL_TABLET | Freq: Four times a day (QID) | ORAL | Status: DC
  Administered 2019-03-22: 22:00:00 0.1 mg via ORAL
  Filled 2019-03-22: qty 1

## 2019-03-22 MED ORDER — LORAZEPAM 1 MG PO TABS: 1.0000 mg | ORAL_TABLET | Freq: Three times a day (TID) | ORAL | 0 refills | Status: AC | PRN

## 2019-03-22 MED ORDER — LOPERAMIDE HCL 2 MG PO CAPS: 2.0000 mg | ORAL_CAPSULE | ORAL | Status: DC | PRN

## 2019-03-22 MED ORDER — METHOCARBAMOL 500 MG PO TABS: 500.0000 mg | ORAL_TABLET | Freq: Three times a day (TID) | ORAL | Status: DC | PRN

## 2019-03-22 MED ORDER — NAPROXEN 500 MG PO TABS: 500.0000 mg | ORAL_TABLET | Freq: Two times a day (BID) | ORAL | Status: DC | PRN

## 2019-03-22 MED ORDER — CLONIDINE HCL 0.1 MG PO TABS: 0.1000 mg | ORAL_TABLET | Freq: Every day | ORAL | Status: DC

## 2019-03-22 MED ORDER — DICYCLOMINE HCL 20 MG PO TABS: 20.0000 mg | ORAL_TABLET | Freq: Four times a day (QID) | ORAL | Status: DC | PRN

## 2019-03-22 MED ORDER — ONDANSETRON HCL 4 MG/2ML IJ SOLN: 4.0000 mg | Freq: Once | INTRAMUSCULAR | Status: DC

## 2019-03-22 MED ORDER — LORAZEPAM 1 MG PO TABS
2.0000 mg | ORAL_TABLET | Freq: Once | ORAL | Status: AC
  Administered 2019-03-22: 2 mg via ORAL
  Filled 2019-03-22: qty 2

## 2019-03-22 MED ORDER — ONDANSETRON 4 MG PO TBDP: 4.0000 mg | ORAL_TABLET | Freq: Four times a day (QID) | ORAL | Status: DC | PRN

## 2019-03-22 MED ORDER — SODIUM CHLORIDE 0.9 % IV BOLUS: 1000.0000 mL | Freq: Once | INTRAVENOUS | Status: DC

## 2019-03-22 MED ORDER — CLONIDINE HCL 0.2 MG PO TABS: 0.2000 mg | ORAL_TABLET | Freq: Two times a day (BID) | ORAL | 0 refills | Status: AC

## 2019-03-22 MED ORDER — SODIUM CHLORIDE 0.9 % IV SOLN: INTRAVENOUS | Status: DC

## 2019-03-22 MED ORDER — NICOTINE 14 MG/24HR TD PT24
14.0000 mg | MEDICATED_PATCH | Freq: Every day | TRANSDERMAL | Status: DC
  Administered 2019-03-22: 22:00:00 14 mg via TRANSDERMAL
  Filled 2019-03-22: qty 1

## 2019-03-22 NOTE — Discharge Instructions (Signed)
[employer] will contact you in the morning.  This will help you with assistance in the community.  Take the clonidine as directed take the Ativan as directed.

## 2019-03-22 NOTE — ED Notes (Signed)
Pt refused IV placement if we are "only giving Zofran and fluids, he wants something for anxiety or withdrawals" Pt stated he would leave and go get high or find another place to go if we don't give him medication.

## 2019-03-22 NOTE — ED Notes (Signed)
Pt verbalized discharge instructions and follow up care. Alert and ambulatory. No iv.  

## 2019-03-22 NOTE — ED Provider Notes (Signed)
[facility] Note   CSN: [id number] Arrival date & time: 03/22/19  1844     History   Chief Complaint No chief complaint on file.   HPI Cameron Carroll is a 28 y.o. male.     Patient presents for heroin detox.  Last used yesterday said initially but he did give a small dose here this afternoon.  Patient states he does not feel that he feels tachycardic.  Patient denies any suicidal or homicidal ideations.  Patient admits to marijuana use occasionally some methamphetamine.  Used to drink alcohol heavily but has stopped that.  Patient denies any nausea vomiting or diarrhea.     History reviewed. No pertinent past medical history.  Patient Active Problem List   Diagnosis Date Noted   Acute hepatitis A    Elevated LFTs    Jaundice    Acute hepatitis 01/29/2019    Past Surgical History:  Procedure Laterality Date   TONSILLECTOMY          Home Medications    Prior to Admission medications   Medication Sig Start Date End Date Taking? Authorizing Provider  cloNIDine (CATAPRES) 0.2 MG tablet Take 1 tablet (0.2 mg total) by mouth 2 (two) times daily. 03/22/19   Vanetta Mulders, MD  folic acid (FOLVITE) 1 MG tablet Take 1 tablet (1 mg total) by mouth daily. 02/03/19   Mikhail, Nita Sells, DO  LORazepam (ATIVAN) 1 MG tablet Take 1 tablet (1 mg total) by mouth 3 (three) times daily as needed for anxiety. 03/22/19   Vanetta Mulders, MD  Multiple Vitamin (MULTIVITAMIN WITH MINERALS) TABS tablet Take 1 tablet by mouth daily. 02/03/19   Mikhail, Nita Sells, DO  nicotine (NICODERM CQ - DOSED IN MG/24 HOURS) 21 mg/24hr patch Place 1 patch (21 mg total) onto the skin daily. 02/03/19   Mikhail, Nita Sells, DO  thiamine 100 MG tablet Take 1 tablet (100 mg total) by mouth daily. 02/03/19   Edsel Petrin, DO    Family History History reviewed. No pertinent family history.  Social History Social History   Tobacco Use   Smoking status:  Current Every Day Smoker    Types: Cigarettes   Smokeless tobacco: Never Used  Substance Use Topics   Alcohol use: Yes   Drug use: Yes    Types: IV     Allergies   Other and Pistachio nut extract skin test   Review of Systems Review of Systems  Constitutional: Positive for fatigue. Negative for chills and fever.  HENT: Negative for rhinorrhea and sore throat.   Eyes: Negative for visual disturbance.  Respiratory: Negative for cough and shortness of breath.   Cardiovascular: Negative for chest pain and leg swelling.  Gastrointestinal: Negative for abdominal pain, diarrhea, nausea and vomiting.  Genitourinary: Negative for dysuria.  Musculoskeletal: Negative for back pain and neck pain.  Skin: Negative for rash.  Neurological: Negative for dizziness, light-headedness and headaches.  Hematological: Does not bruise/bleed easily.  Psychiatric/Behavioral: Negative for confusion.     Physical Exam Updated Vital Signs BP (!) 143/90 (BP Location: Left Arm)    Pulse (!) 105    Temp 98.9 F (37.2 C) (Oral)    Resp 19    SpO2 100%   Physical Exam Vitals signs and nursing note reviewed.  Constitutional:      General: He is not in acute distress.    Appearance: Normal appearance. He is well-developed. He is not ill-appearing, toxic-appearing or diaphoretic.  HENT:     Head:  Normocephalic and atraumatic.  Eyes:     Conjunctiva/sclera: Conjunctivae normal.     Pupils: Pupils are equal, round, and reactive to light.  Neck:     Musculoskeletal: Normal range of motion and neck supple.  Cardiovascular:     Rate and Rhythm: Regular rhythm. Tachycardia present.     Heart sounds: No murmur.  Pulmonary:     Effort: Pulmonary effort is normal. No respiratory distress.     Breath sounds: Normal breath sounds.  Abdominal:     Palpations: Abdomen is soft.     Tenderness: There is no abdominal tenderness.  Musculoskeletal: Normal range of motion.  Skin:    General: Skin is warm and  dry.     Capillary Refill: Capillary refill takes less than 2 seconds.  Neurological:     General: No focal deficit present.     Mental Status: He is alert and oriented to person, place, and time.     Cranial Nerves: No cranial nerve deficit.     Sensory: No sensory deficit.     Motor: No weakness.      ED Treatments / Results  Labs (all labs ordered are listed, but only abnormal results are displayed) Labs Reviewed  COMPREHENSIVE METABOLIC PANEL - Abnormal; Notable for the following components:      Result Value   Total Protein 8.3 (*)    AST 77 (*)    ALT 155 (*)    All other components within normal limits  RAPID URINE DRUG SCREEN, HOSP PERFORMED - Abnormal; Notable for the following components:   Opiates POSITIVE (*)    Amphetamines POSITIVE (*)    Tetrahydrocannabinol POSITIVE (*)    All other components within normal limits  CBC WITH DIFFERENTIAL/PLATELET  ETHANOL    EKG EKG Interpretation  Date/Time:  Wednesday March 22 2019 20:54:33 EDT Ventricular Rate:  100 PR Interval:  136 QRS Duration: 88 QT Interval:  336 QTC Calculation: 433 R Axis:   68 Text Interpretation:  Normal sinus rhythm Normal ECG No previous ECGs available Confirmed by Vanetta MuldersZackowski, Charlsie Fleeger [phone]([id number]) on 03/22/2019 9:00:14 PM   Radiology No results found.  Procedures Procedures (including critical care time)  Medications Ordered in ED Medications  0.9 %  sodium chloride infusion ( Intravenous Refused 03/22/19 2117)  sodium chloride 0.9 % bolus 1,000 mL (1,000 mLs Intravenous Refused 03/22/19 2117)  ondansetron (ZOFRAN) injection 4 mg (4 mg Intravenous Refused 03/22/19 2116)  dicyclomine (BENTYL) tablet 20 mg (has no administration in time range)  hydrOXYzine (ATARAX/VISTARIL) tablet 25 mg (has no administration in time range)  loperamide (IMODIUM) capsule 2-4 mg (has no administration in time range)  methocarbamol (ROBAXIN) tablet 500 mg (has no administration in time range)  naproxen  (NAPROSYN) tablet 500 mg (has no administration in time range)  ondansetron (ZOFRAN-ODT) disintegrating tablet 4 mg (has no administration in time range)  cloNIDine (CATAPRES) tablet 0.1 mg (0.1 mg Oral Given 03/22/19 2147)    Followed by  cloNIDine (CATAPRES) tablet 0.1 mg (has no administration in time range)    Followed by  cloNIDine (CATAPRES) tablet 0.1 mg (has no administration in time range)  nicotine (NICODERM CQ - dosed in mg/24 hours) patch 14 mg (14 mg Transdermal Patch Applied 03/22/19 2156)  LORazepam (ATIVAN) tablet 2 mg (2 mg Oral Given 03/22/19 2120)     Initial Impression / Assessment and Plan / ED Course  I have reviewed the triage vital signs and the nursing notes.  Pertinent labs & imaging results that  were available during my care of the patient were reviewed by me and considered in my medical decision making (see chart for details).       Patient did not want IV.  So treated him with p.o. clonidine and Ativan.  Also gave him a nicotine patch.  Urine drug screen positive for opiates amphetamines and THC.  Discussed with behavioral health.  Patient does not meet criteria for inpatient treatment since not suicidal or homicidal.  Patient continues to deny that.  Patient will be given peers support to they will contact him tomorrow.  Patient discharged home with clonidine and Ativan.  Patient on presentation his heart rate was around 113.  On EKG patient's heart rate was 100.  Patient did refuse cardiac monitoring.  Blood pressure was initially 138/83 oxygen saturations 100%.   Final Clinical Impressions(s) / ED Diagnoses   Final diagnoses:  Heroin withdrawal ([place])  Polysubstance abuse [facility])    ED Discharge Orders         Ordered    LORazepam (ATIVAN) 1 MG tablet  3 times daily PRN     03/22/19 2303    cloNIDine (CATAPRES) 0.2 MG tablet  2 times daily     03/22/19 2303           Fredia Sorrow, MD 03/22/19 2309

## 2019-03-22 NOTE — ED Triage Notes (Signed)
Pt requesting detox from heroin, last used yesterday, pt states that he doesn't feel good and he is tachycardic

## 2019-03-22 NOTE — BH Assessment (Signed)
[facility] Assessment Progress Note   Clinician spoke with Dr. Rogene Houston.  Patient is not voicing SI or HI.  He wants detox from heroin.  Patient is a polysubstance abuser.  Clinician suggested a peer support referral for patient.

## 2021-06-27 IMAGING — US ULTRASOUND ABDOMEN LIMITED
1 series · 14 of 25 positions shown · non-contrast
Comparison: None.

CLINICAL DATA: 28-year-old male with jaundice, elevated LFTs and
vomiting.

EXAM:
ULTRASOUND ABDOMEN LIMITED RIGHT UPPER QUADRANT

[Series 1: ultrasound abdomen limited · 14 of 66 slices shown]
[im 1/66]
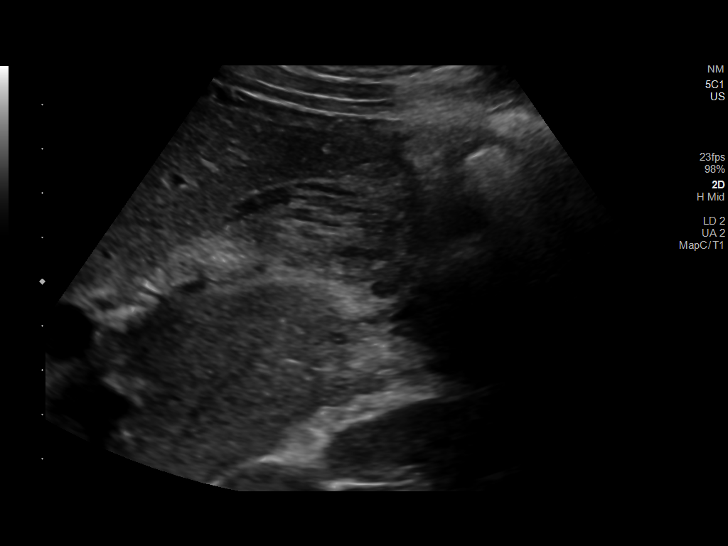
[im 6/66]
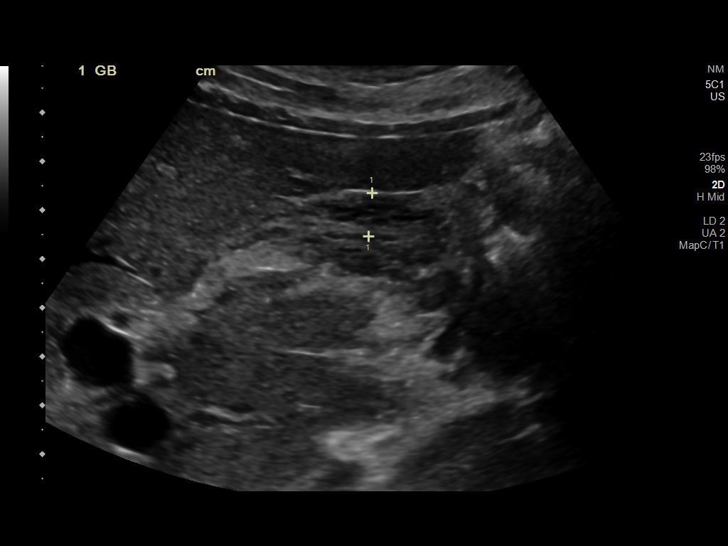
[im 11/66]
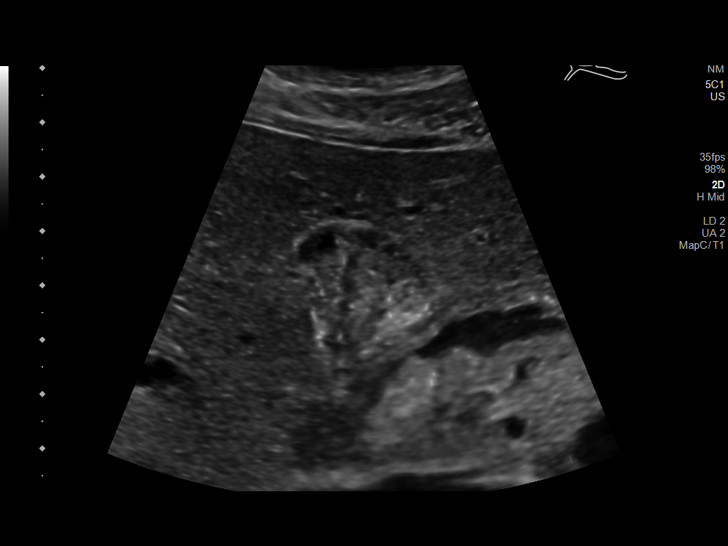
[im 17/66]
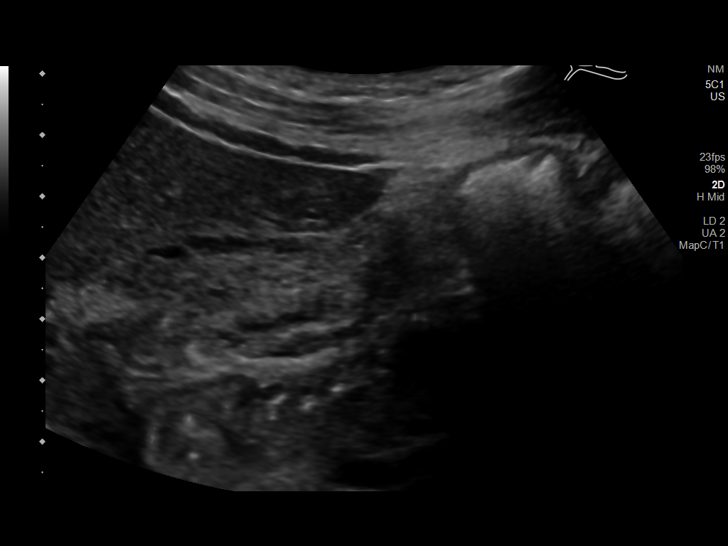
[im 22/66]
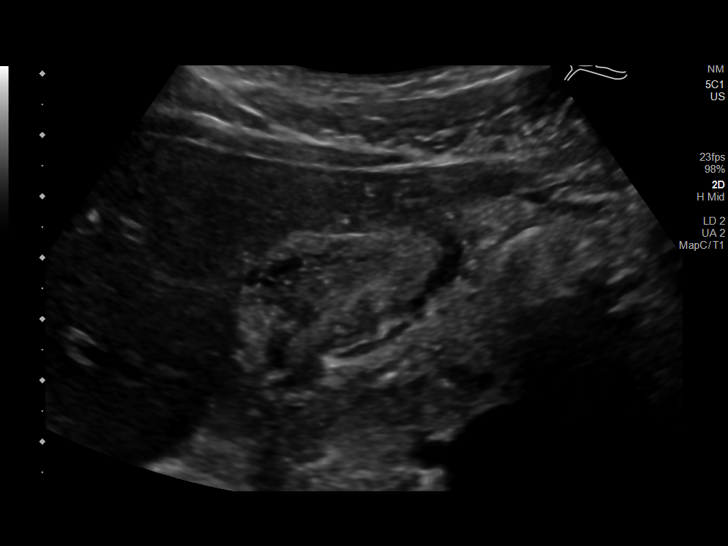
[im 25/66]
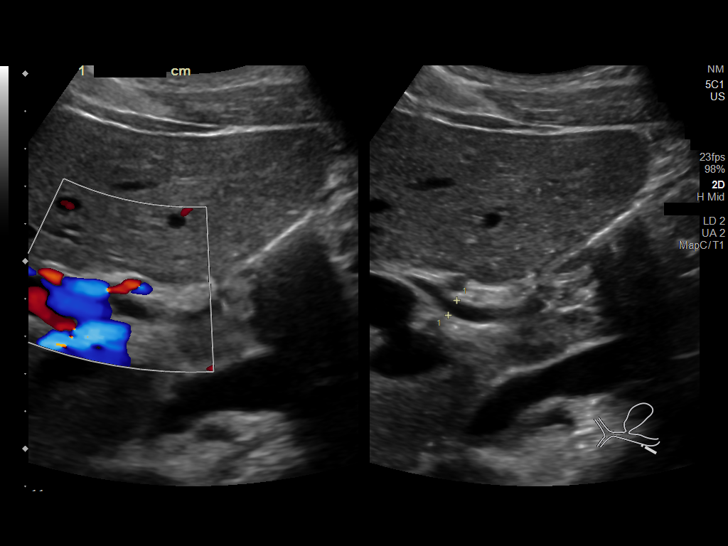
[im 30/66]
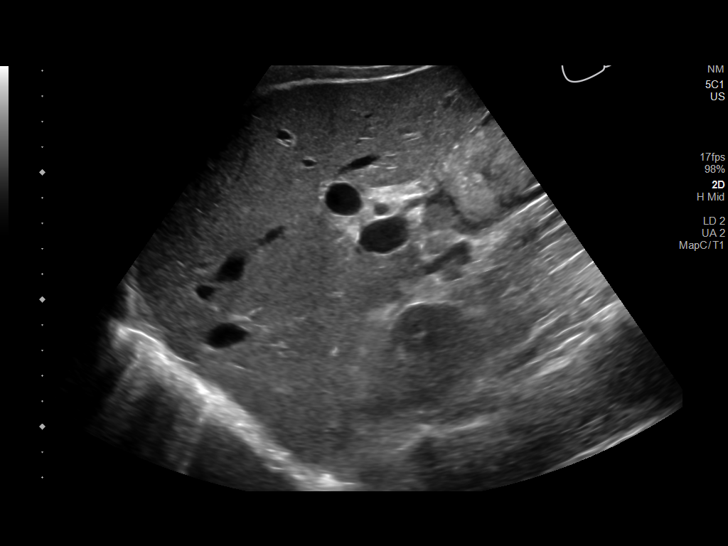
[im 36/66]
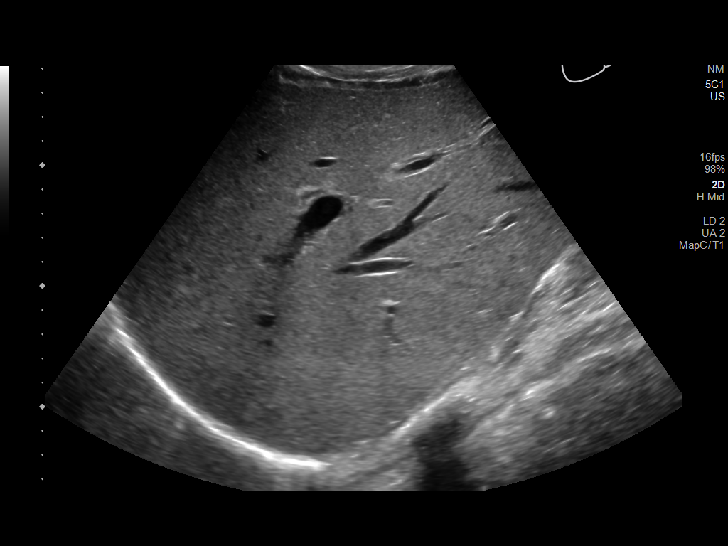
[im 41/66]
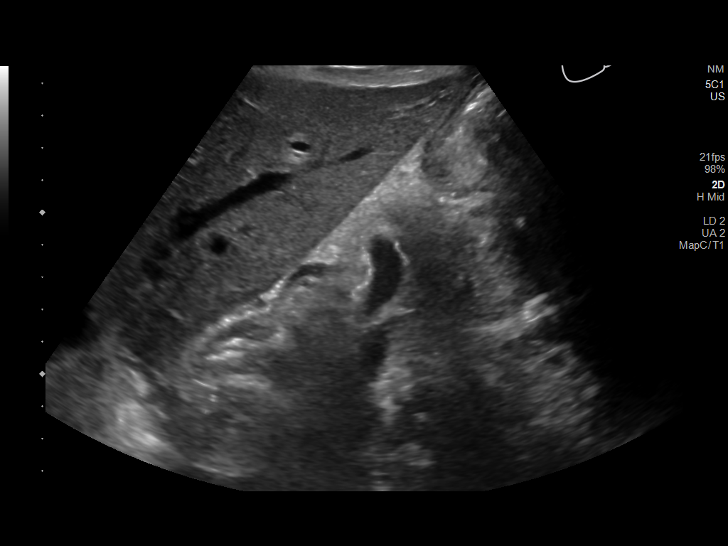
[im 44/66]
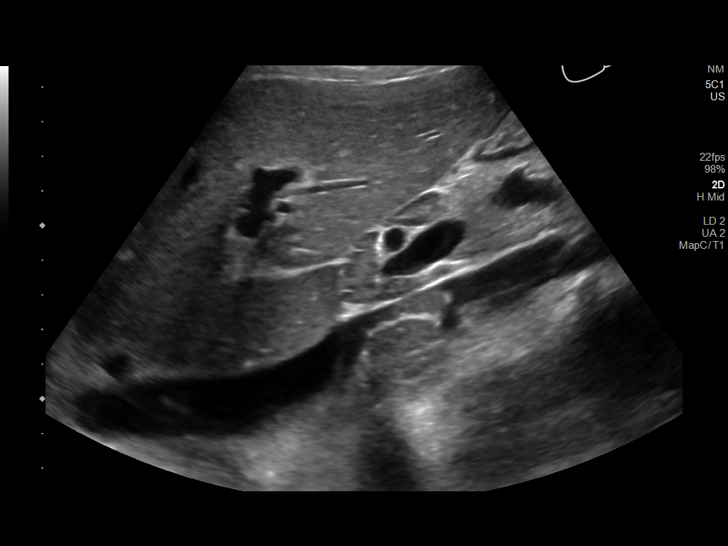
[im 49/66]
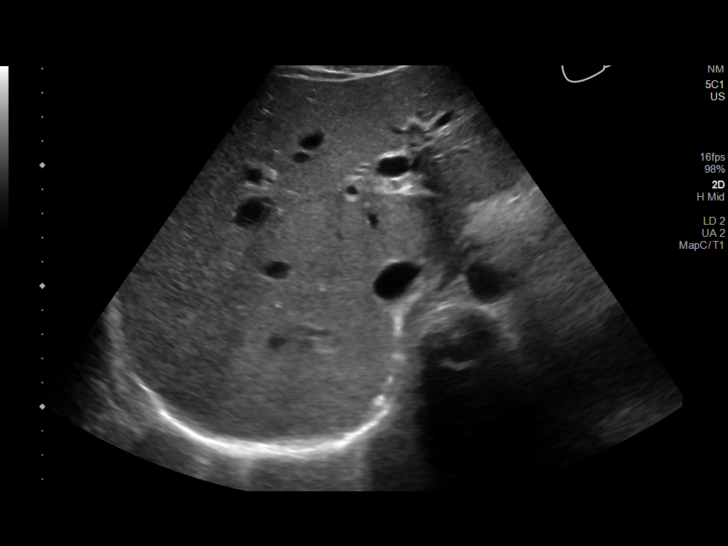
[im 55/66]
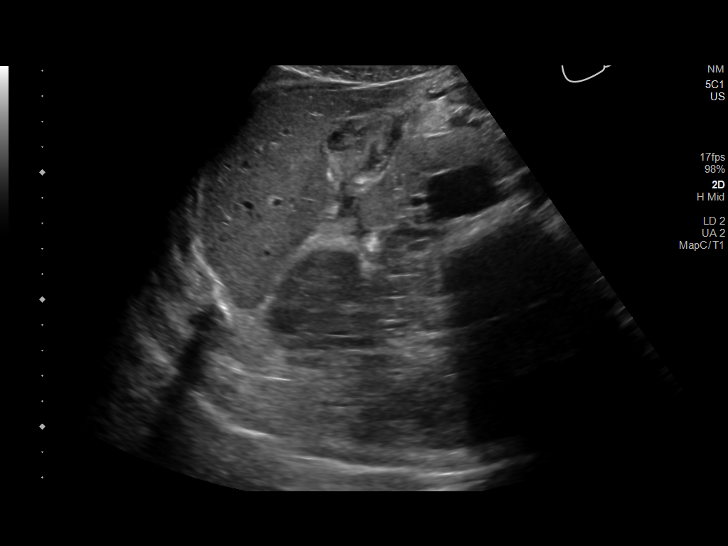
[im 60/66]
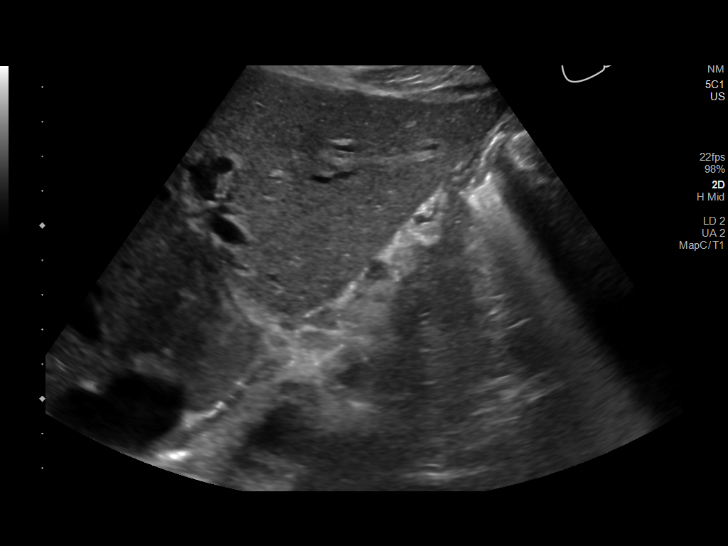
[im 66/66]
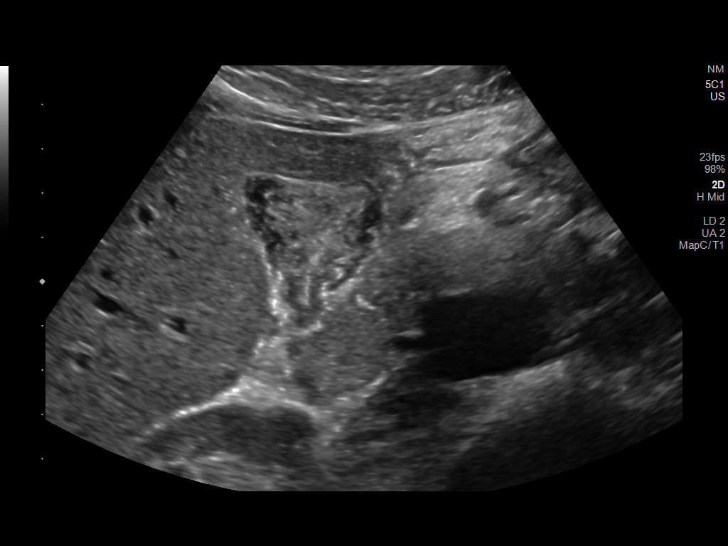

[14 of 25 positions shown; findings below may reference images not displayed]

FINDINGS: Gallbladder:

Gallbladder is contracted. Apparent wall thickening could be
secondary to contraction. No evidence of sonographic Murphy sign or
pericholecystic fluid. No definite cholelithiasis noted.

Common bile duct:

Diameter: 4 mm. No evidence of intrahepatic or extrahepatic biliary
dilatation.

Liver:

No focal lesion identified. Within normal limits in parenchymal
echogenicity. Portal vein is patent on color Doppler imaging with
normal direction of blood flow towards the liver.

Other: None.
IMPRESSION: 1. Apparent wall thickening of the gallbladder which may be
secondary to contracted state. No definite cholelithiasis or other
signs of acute cholecystitis.
2. Unremarkable liver.  No biliary dilatation.

## 2021-06-27 IMAGING — CR PORTABLE CHEST - 1 VIEW
2 series · 2 of 2 positions shown · non-contrast
Comparison: None.

CLINICAL DATA: Acute shortness of breath.

EXAM:
PORTABLE CHEST 1 VIEW

[AP (1 of 2)]
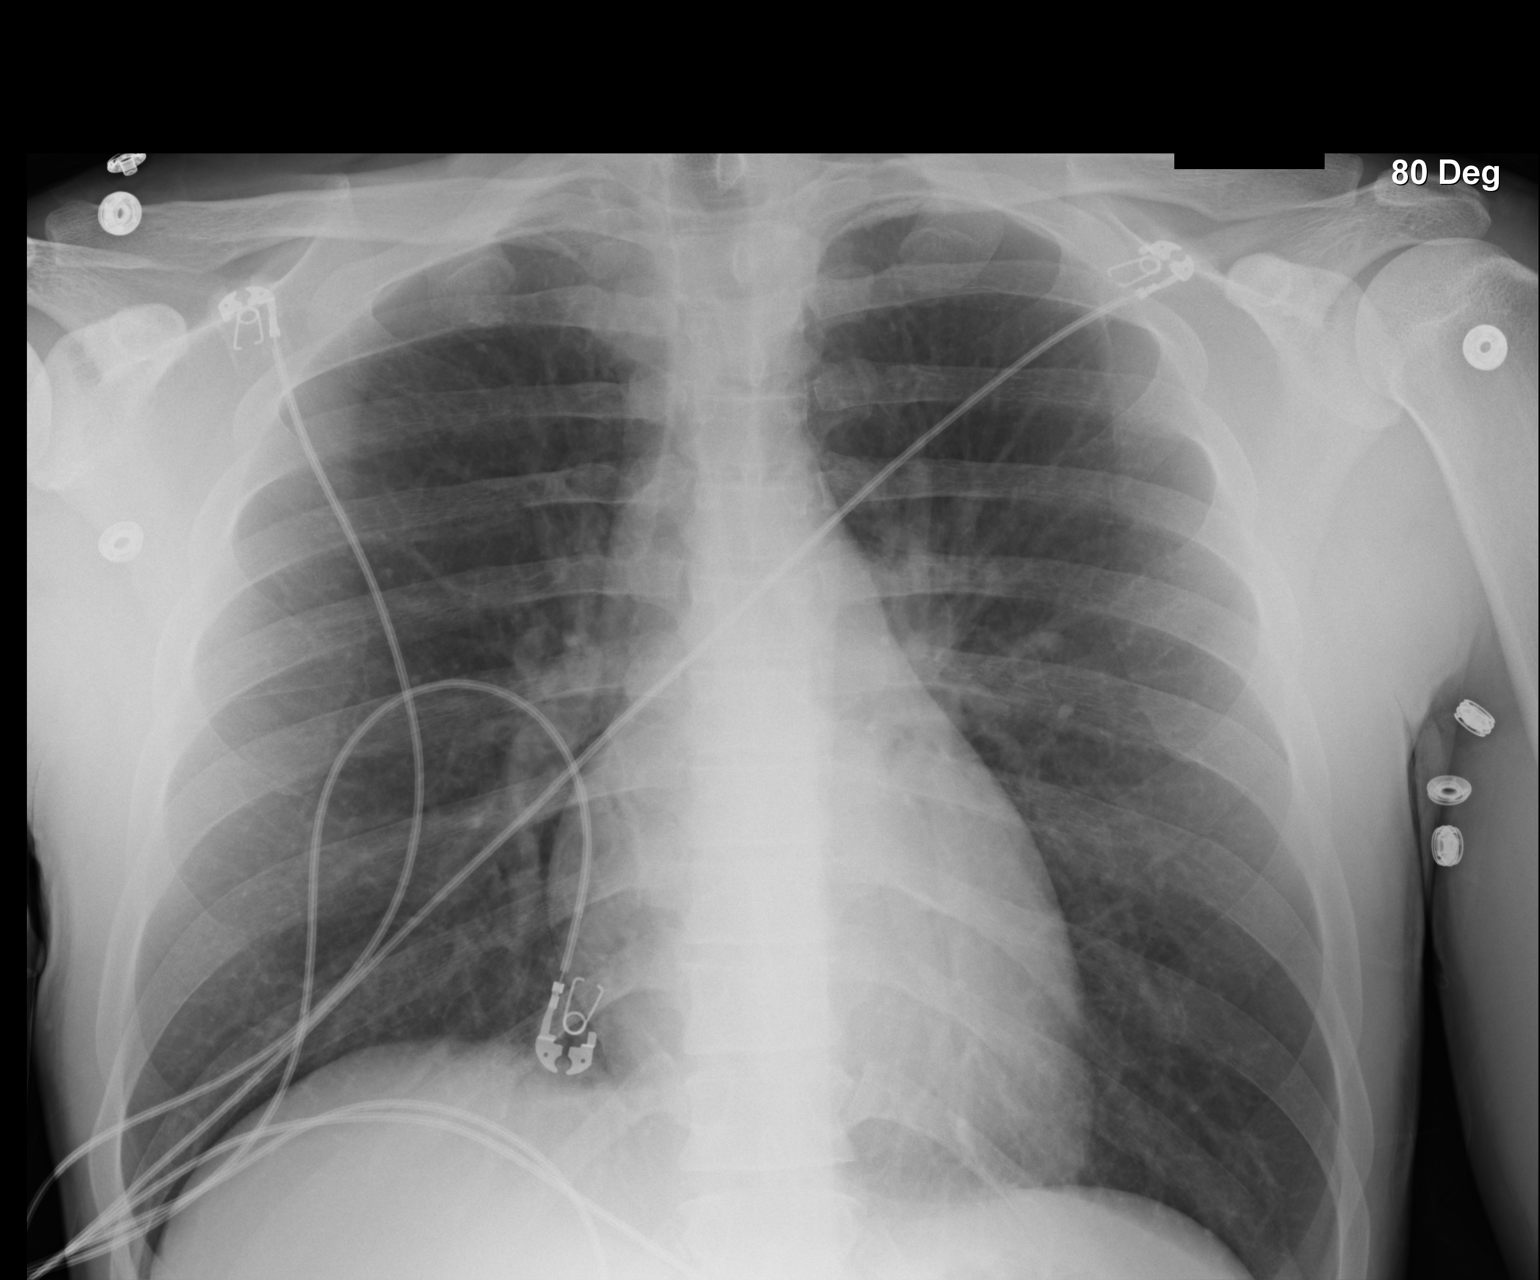

[AP (2 of 2)]
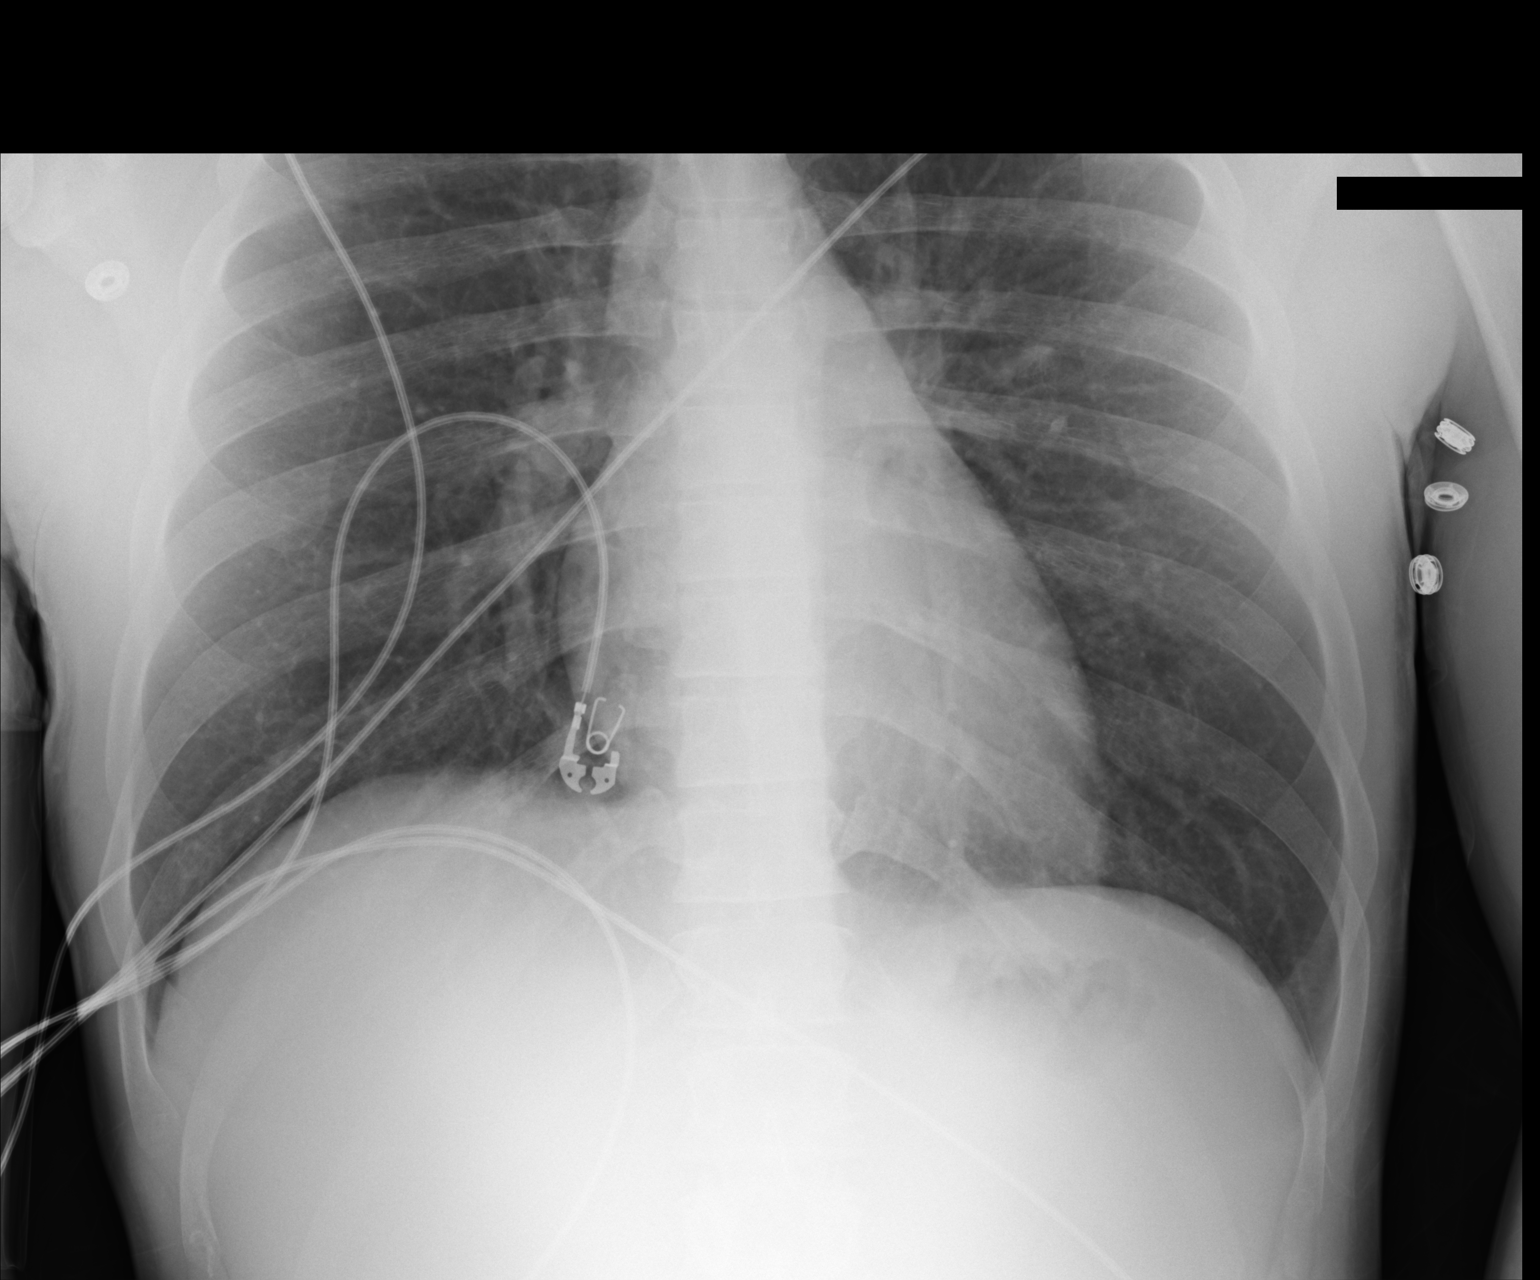

[2 of 2 positions shown; findings below may reference images not displayed]

FINDINGS: The cardiomediastinal silhouette is unremarkable.

There is no evidence of focal airspace disease, pulmonary edema,
suspicious pulmonary nodule/mass, pleural effusion, or pneumothorax.

No acute bony abnormalities are identified.
IMPRESSION: No active disease.
